# Patient Record
Sex: Female | Born: 1975 | Race: White | Hispanic: No | Marital: Married | Smoking: Never smoker
Health system: Southern US, Community
[De-identification: ages and names within clinical notes are randomized; demographics above are authoritative.]

## PROBLEM LIST (undated history)

## (undated) DIAGNOSIS — Z789 Other specified health status: Secondary | ICD-10-CM

## (undated) DIAGNOSIS — R112 Nausea with vomiting, unspecified: Secondary | ICD-10-CM

## (undated) DIAGNOSIS — K589 Irritable bowel syndrome without diarrhea: Secondary | ICD-10-CM

## (undated) DIAGNOSIS — A64 Unspecified sexually transmitted disease: Secondary | ICD-10-CM

## (undated) DIAGNOSIS — Z803 Family history of malignant neoplasm of breast: Secondary | ICD-10-CM

## (undated) HISTORY — PX: OTHER SURGICAL HISTORY: SHX169

## (undated) HISTORY — DX: Nausea with vomiting, unspecified: R11.2

## (undated) HISTORY — DX: Irritable bowel syndrome, unspecified: K58.9

## (undated) HISTORY — DX: Unspecified sexually transmitted disease: A64

## (undated) HISTORY — DX: Family history of malignant neoplasm of breast: Z80.3

---

## 2004-11-23 ENCOUNTER — Emergency Department (HOSPITAL_COMMUNITY): Admission: EM | Admit: 2004-11-23 | Discharge: 2004-11-23 | Payer: Self-pay | Admitting: Emergency Medicine

## 2005-11-03 ENCOUNTER — Encounter: Admission: RE | Admit: 2005-11-03 | Discharge: 2005-12-05 | Payer: Self-pay | Admitting: Sports Medicine

## 2010-05-20 ENCOUNTER — Other Ambulatory Visit: Payer: Self-pay | Admitting: Family Medicine

## 2010-05-20 ENCOUNTER — Ambulatory Visit
Admission: RE | Admit: 2010-05-20 | Discharge: 2010-05-20 | Disposition: A | Discharge status: Home / Self Care | Payer: PRIVATE HEALTH INSURANCE | Source: Ambulatory Visit | Attending: Family Medicine | Admitting: Family Medicine

## 2010-06-24 LAB — HIV ANTIBODY (ROUTINE TESTING W REFLEX): HIV: NONREACTIVE

## 2010-06-24 LAB — HEPATITIS B SURFACE ANTIGEN: Hepatitis B Surface Ag: NEGATIVE

## 2010-06-24 LAB — GC/CHLAMYDIA PROBE AMP, GENITAL: Gonorrhea: NEGATIVE

## 2010-07-15 DIAGNOSIS — A088 Other specified intestinal infections: Secondary | ICD-10-CM

## 2010-08-26 ENCOUNTER — Inpatient Hospital Stay (HOSPITAL_COMMUNITY)
Admission: AD | Admit: 2010-08-26 | Discharge: 2010-08-26 | Disposition: A | Discharge status: Home / Self Care | Payer: BC Managed Care – PPO | Source: Ambulatory Visit | Attending: Obstetrics and Gynecology | Admitting: Obstetrics and Gynecology

## 2010-08-26 ENCOUNTER — Encounter (HOSPITAL_COMMUNITY): Payer: Self-pay | Admitting: *Deleted

## 2010-08-26 DIAGNOSIS — A084 Viral intestinal infection, unspecified: Secondary | ICD-10-CM

## 2010-08-26 DIAGNOSIS — Z3490 Encounter for supervision of normal pregnancy, unspecified, unspecified trimester: Secondary | ICD-10-CM

## 2010-08-26 DIAGNOSIS — A088 Other specified intestinal infections: Secondary | ICD-10-CM | POA: Insufficient documentation

## 2010-08-26 DIAGNOSIS — O210 Mild hyperemesis gravidarum: Secondary | ICD-10-CM | POA: Insufficient documentation

## 2010-08-26 DIAGNOSIS — R109 Unspecified abdominal pain: Secondary | ICD-10-CM | POA: Insufficient documentation

## 2010-08-26 DIAGNOSIS — O26899 Other specified pregnancy related conditions, unspecified trimester: Secondary | ICD-10-CM | POA: Diagnosis present

## 2010-08-26 DIAGNOSIS — O99891 Other specified diseases and conditions complicating pregnancy: Secondary | ICD-10-CM | POA: Insufficient documentation

## 2010-08-26 LAB — COMPREHENSIVE METABOLIC PANEL
ALT: 41 U/L — ABNORMAL HIGH (ref 0–35)
AST: 27 U/L (ref 0–37)
Albumin: 3 g/dL — ABNORMAL LOW (ref 3.5–5.2)
BUN: 8 mg/dL (ref 6–23)
CO2: 24 mEq/L (ref 19–32)
Calcium: 8.5 mg/dL (ref 8.4–10.5)
Chloride: 100 mEq/L (ref 96–112)
Creatinine, Ser: 0.6 mg/dL (ref 0.50–1.10)
GFR calc Af Amer: >60 mL/min (ref >60)
GFR calc non Af Amer: >60 mL/min (ref >60)
Glucose, Bld: 91 mg/dL (ref 70–99)
Potassium: 3.5 mEq/L (ref 3.5–5.1)
Total Bilirubin: 0.6 mg/dL (ref 0.3–1.2)
Total Protein: 7 g/dL (ref 6.0–8.3)

## 2010-08-26 LAB — URINE MICROSCOPIC-ADD ON

## 2010-08-26 LAB — CBC
HCT: 35.4 % — ABNORMAL LOW (ref 36.0–46.0)
MCH: 31.3 pg (ref 26.0–34.0)
MCHC: 34.2 g/dL (ref 30.0–36.0)
MCV: 91.7 fL (ref 78.0–100.0)
Platelets: 186 10*3/uL (ref 150–400)
RBC: 3.86 MIL/uL — ABNORMAL LOW (ref 3.87–5.11)
RDW: 13.3 % (ref 11.5–15.5)
WBC: 11.3 10*3/uL — ABNORMAL HIGH (ref 4.0–10.5)

## 2010-08-26 LAB — URINALYSIS, ROUTINE W REFLEX MICROSCOPIC
Glucose, UA: NEGATIVE mg/dL
Ketones, ur: NEGATIVE mg/dL
Leukocytes, UA: NEGATIVE
Nitrite: NEGATIVE
Protein, ur: NEGATIVE mg/dL
Specific Gravity, Urine: 1.01 (ref 1.005–1.030)
Urobilinogen, UA: 0.2 mg/dL (ref 0.0–1.0)

## 2010-08-26 MED ORDER — OXYCODONE-ACETAMINOPHEN 5-325 MG PO TABS
1.0000 | ORAL_TABLET | Freq: Once | ORAL | Status: AC
  Administered 2010-08-26: 1 via ORAL

## 2010-08-26 MED ORDER — CYCLOBENZAPRINE HCL 10 MG PO TABS: 10.0000 mg | ORAL_TABLET | Freq: Three times a day (TID) | ORAL | Status: AC | PRN

## 2010-08-26 MED ORDER — OXYCODONE-ACETAMINOPHEN 5-325 MG PO TABS: 1.0000 | ORAL_TABLET | Freq: Four times a day (QID) | ORAL | Status: DC | PRN

## 2010-08-26 MED ORDER — GI COCKTAIL ~~LOC~~
30.0000 mL | Freq: Once | ORAL | Status: AC
  Administered 2010-08-26: 30 mL via ORAL

## 2010-08-26 MED ORDER — RANITIDINE HCL 150 MG PO TABS: 150.0000 mg | ORAL_TABLET | Freq: Two times a day (BID) | ORAL | Status: DC

## 2010-08-26 NOTE — ED Provider Notes (Signed)
History   G2P1001 @ 18.2 wks  About 9:30 last night started feeling sick, vomiting started around midnight, frequent until about 5 AM. No vomiting since. Diarrhea x 1 today. Temp of 100.7 earlier today. Ate just a few crackers today, able to drink water. 5:30 PM, right sided chest pain started, moved to RUQ. Occasional nausea now. H/O IBS. No dysuria. Took Zofran about 9 AM, Tylenol 1000 mg three times today, last at 5 PM.  Chief Complaint  Patient presents with  . Fever  . Chest Pain   HPI  OB History    Grav Para Term Preterm Abortions TAB SAB Ect Mult Living   2 1 1       1       History reviewed. No pertinent past medical history.  Past Surgical History  Procedure Date  . Cesarean section   . Left knee surgery     No family history on file.  History  Substance Use Topics  . Smoking status: Never Smoker   . Smokeless tobacco: Not on file  . Alcohol Use: No    Allergies:  Allergies  Allergen Reactions  . Iodine Rash  . Zithromax (Azithromycin Dihydrate) Rash    No prescriptions prior to admission    Review of Systems  Constitutional: Positive for fever and chills.  HENT: Negative for congestion and sore throat.   Eyes: Negative.   Respiratory: Negative.   Cardiovascular: Negative.   Gastrointestinal: Positive for nausea, vomiting, abdominal pain and diarrhea. Negative for constipation.  Neurological: Positive for headaches.   Physical Exam   Blood pressure 110/68, pulse 91, temperature 98.9 F (37.2 C), temperature source Oral, resp. rate 18, height 5' 2.75" (1.594 m), weight 75.978 kg (167 lb 8 oz).  Physical Exam  Nursing note and vitals reviewed. Constitutional: She is oriented to person, place, and time. She appears well-developed and well-nourished. No distress.  HENT:  Head: Normocephalic and atraumatic.  Cardiovascular: Normal rate.   Respiratory: Effort normal. She exhibits tenderness (left rib cage tender).  GI: Soft. Bowel sounds are normal.  She exhibits no mass. There is tenderness (epigastric). There is no rebound, no guarding, no CVA tenderness, no tenderness at McBurney's point and negative Murphy's sign.  Genitourinary: Enlarged: Size c/w dates.  Musculoskeletal: Normal range of motion.  Neurological: She is alert and oriented to person, place, and time.  Skin: Skin is warm and dry.  Psychiatric: She has a normal mood and affect.    MAU Course  Procedures Pain improved following GI cocktail and Percocet, ok to d/c home per Dr. Arelia Sneddon - f/u in office on Monday   Assessment and Plan  34 y.o. G2P1001 at [redacted]w[redacted]d F/U in office on Monday for repeat LFTs  Robin Black, Robin Black  Home Medication Instructions [id number]   Printed on:08/27/10 0258  Medication Information                    acetaminophen (TYLENOL) 500 MG tablet Take 1,000 mg by mouth every 6 (six) hours as needed. As needed for pain            Prenatal Multivit-Min-Fe-FA (PRENATAL #2 PO) Take 1 tablet by mouth daily.             DULoxetine (CYMBALTA) 30 MG capsule Take 30 mg by mouth daily. Weaning off, currently taking 30mg  every other day            ondansetron (ZOFRAN-ODT) 8 MG disintegrating tablet Take 8 mg by mouth every 8 (  eight) hours as needed. As needed for nausea and vomiting            oxyCODONE-acetaminophen (ROXICET) 5-325 MG per tablet Take 1 tablet by mouth every 6 (six) hours as needed for pain.           ranitidine (ZANTAC) 150 MG tablet Take 1 tablet (150 mg total) by mouth 2 (two) times daily.           cyclobenzaprine (FLEXERIL) 10 MG tablet Take 1 tablet (10 mg total) by mouth 3 (three) times daily as needed for muscle spasms.              Deacon Gadbois 08/26/2010, 9:15 PM

## 2010-08-26 NOTE — Progress Notes (Signed)
Pt states, " I started vomiting at midnight and it lasted until 5 am. I'm mostly been in the bed and sleep. Im keeping fluids down now. I have pain in the right side of my chest all the way to my ribs and it hurts when I take a deep breath. It started out like a sharp pain, and then it felt like a bubble moving and it has settled under my ribs. I feel achy and can't get comfortable. My temp was 100.7. I took tylenol at 4:4." 5 pm

## 2010-08-26 NOTE — Progress Notes (Signed)
Patient is here with c/o n/v last night. She states that had 100.51f fever and took 1000mg  tylenol at 1640pm and zofran 8mg  this morning. She is still c/o nausea, ruq radiates to the back. She denies any vaginal bleeding or discharge.

## 2010-08-31 ENCOUNTER — Other Ambulatory Visit (HOSPITAL_COMMUNITY): Payer: Self-pay | Admitting: Obstetrics and Gynecology

## 2010-08-31 DIAGNOSIS — R1011 Right upper quadrant pain: Secondary | ICD-10-CM

## 2010-09-01 ENCOUNTER — Ambulatory Visit (HOSPITAL_COMMUNITY)
Admission: RE | Admit: 2010-09-01 | Discharge: 2010-09-01 | Disposition: A | Discharge status: Home / Self Care | Payer: BC Managed Care – PPO | Source: Ambulatory Visit | Attending: Obstetrics and Gynecology | Admitting: Obstetrics and Gynecology

## 2010-09-01 DIAGNOSIS — K824 Cholesterolosis of gallbladder: Secondary | ICD-10-CM | POA: Insufficient documentation

## 2010-09-01 DIAGNOSIS — O99891 Other specified diseases and conditions complicating pregnancy: Secondary | ICD-10-CM | POA: Insufficient documentation

## 2010-09-01 DIAGNOSIS — R1011 Right upper quadrant pain: Secondary | ICD-10-CM | POA: Insufficient documentation

## 2010-09-02 ENCOUNTER — Ambulatory Visit (HOSPITAL_COMMUNITY): Payer: BC Managed Care – PPO

## 2010-09-05 ENCOUNTER — Ambulatory Visit (INDEPENDENT_AMBULATORY_CARE_PROVIDER_SITE_OTHER): Payer: Self-pay | Admitting: Surgery

## 2010-09-05 LAB — US OB COMP ADDL GEST + 14 WK

## 2010-09-14 ENCOUNTER — Ambulatory Visit (INDEPENDENT_AMBULATORY_CARE_PROVIDER_SITE_OTHER): Payer: BC Managed Care – PPO | Admitting: General Surgery

## 2010-09-14 ENCOUNTER — Encounter (INDEPENDENT_AMBULATORY_CARE_PROVIDER_SITE_OTHER): Payer: Self-pay | Admitting: General Surgery

## 2010-09-14 VITALS — BP 122/78 | HR 86 | Temp 96.8°F | Wt 168.8 lb

## 2010-09-14 DIAGNOSIS — K8020 Calculus of gallbladder without cholecystitis without obstruction: Secondary | ICD-10-CM

## 2010-09-14 NOTE — Patient Instructions (Signed)
Continue with low fat diet small servings, and not eating and going to the bed. If you have a episode of pain you may take Vicodin or Percocet if the pain does not resolve and you have to take a second pain tablet leaves let us know you have fever or think you are turning yellow let us know the promply.

## 2010-09-14 NOTE — Progress Notes (Signed)
Subjective:     Patient ID: Robin Black, female   DOB: 1975/06/02, 35 y.o.   MRN: [id number]  HPIPatient's a 35 year old Caucasian female mother one with her second pregnancy presently approximately 18 weeks area the patient has had a lot of right upper quadrant abdominal pain that started basically about the time of her pregnancy and originally was thought to be nonspecific she's had more recently a severe episode of painless motion of a lot of nausea and vomiting this occurred after eating steak and accessory is approximately 10 days ago. The pain lasted approximately 24 hours and then the pain has since decreased. She was seen and [facility] and Dr. Carloyn Jaeger and they did laboratory studies white count was 11,500 hematocrit 33 normal electrolytes glucose 129, normal bilirubin, slightly elevated SGOT SGPT. The patient was then sent for an ultrasound that showed a mildly dilated gallbladder with what appears to be a large single stone that didn't i and that they're sludge in the more dependent portion of the gallbladder.. The patient's pain has  decreased essentially asymptomatic at this .   The patient's husband is present for this examination. I reviewed the little booklet on gallbladder function were and she has a copy of that book.  Review of Systems One previous pregnancy  girl 10. Sh that plan to induce her and probably a C-section in the last week in December . Current Outpatient Prescriptions  Medication Sig Dispense Refill  . acetaminophen (TYLENOL) 500 MG tablet Take 1,000 mg by mouth every 6 (six) hours as needed. As needed for pain       . DULoxetine (CYMBALTA) 30 MG capsule Take 30 mg by mouth daily. Weaning off, currently taking 30mg  every other day       . ondansetron (ZOFRAN-ODT) 8 MG disintegrating tablet Take 8 mg by mouth every 8 (eight) hours as needed. As needed for nausea and vomiting       . oxyCODONE-acetaminophen (ROXICET) 5-325 MG per tablet Take 1 tablet by  mouth every 6 (six) hours as needed for pain.  20 tablet  0  . Prenatal Multivit-Min-Fe-FA (PRENATAL #2 PO) Take 1 tablet by mouth daily.        . ranitidine (ZANTAC) 150 MG tablet Take 1 tablet (150 mg total) by mouth 2 (two) times daily.  60 tablet  1   Past Surgical History  Procedure Date  . Cesarean section   . Left knee surgery    Allergies  Allergen Reactions  . Iodine Rash  . Zithromax (Azithromycin Dihydrate) Rash  Family history is negative for gallstones according to the patient       Objective:   Physical ExamBP 122/78  Pulse 86  Temp(Src) 96.8 F (36 C) (Temporal)  Wt 168 lb 12.8 oz (76.567 kg) Pleasant female no acute distress his office to pregnant. Her uterus is now proximal right-hand size above her umbilicus and she's not tender in the right upper quadrant she has not had nausea and vomited in the last view days.    Assessment:    Patient's presently 18 and 19 weeks turn A. intrauterine pregnancy and presently essentially asymptomatic from the gallbladder. I would recommend that we continue with her all her low-fat diet not a large meal not going to bed within 3-4 hours after eating. She understands that she has an episode of pain she can take Percocet which she has she has to take a second pain tablet with back the pain resolved and or develops  fever or generalized symptoms that we be notified promptly. She would need to be in [facility] in both her gynecologist and general surgeon would work together if she is having meniscus symptoms closer to her term we discussed the options of possibly percutaneous drainage of the gallbladder but hopefully that will not be need area hopefully if she's had a schedule C-section and we could plan on doing an elective cholecystectomy sometime in January if no acute issues occur she is aware that I am retiring at the end of the year and one of my partners will be performing her surgery hopefully at [facility].     Plan:       See above note

## 2010-11-09 ENCOUNTER — Emergency Department (HOSPITAL_COMMUNITY): Payer: BC Managed Care – PPO

## 2010-11-09 ENCOUNTER — Inpatient Hospital Stay (HOSPITAL_COMMUNITY)
Admission: AD | Admit: 2010-11-09 | Discharge: 2010-11-09 | Disposition: A | Discharge status: Home / Self Care | Payer: BC Managed Care – PPO | Source: Ambulatory Visit | Attending: Obstetrics and Gynecology | Admitting: Obstetrics and Gynecology

## 2010-11-09 ENCOUNTER — Inpatient Hospital Stay (HOSPITAL_COMMUNITY)
Admission: AD | Admit: 2010-11-09 | Payer: BC Managed Care – PPO | Source: Ambulatory Visit | Admitting: Obstetrics and Gynecology

## 2010-11-09 ENCOUNTER — Encounter (HOSPITAL_COMMUNITY): Payer: Self-pay | Admitting: *Deleted

## 2010-11-09 ENCOUNTER — Inpatient Hospital Stay (HOSPITAL_COMMUNITY): Payer: BC Managed Care – PPO

## 2010-11-09 ENCOUNTER — Encounter (HOSPITAL_COMMUNITY): Payer: Self-pay

## 2010-11-09 ENCOUNTER — Emergency Department (HOSPITAL_COMMUNITY)
Admission: EM | Admit: 2010-11-09 | Discharge: 2010-11-09 | Disposition: A | Discharge status: Other Acute Inpatient Hospital | Payer: BC Managed Care – PPO | Source: Home / Self Care | Attending: Emergency Medicine | Admitting: Emergency Medicine

## 2010-11-09 DIAGNOSIS — Z3490 Encounter for supervision of normal pregnancy, unspecified, unspecified trimester: Secondary | ICD-10-CM

## 2010-11-09 DIAGNOSIS — M545 Low back pain, unspecified: Secondary | ICD-10-CM | POA: Insufficient documentation

## 2010-11-09 DIAGNOSIS — M549 Dorsalgia, unspecified: Secondary | ICD-10-CM | POA: Insufficient documentation

## 2010-11-09 DIAGNOSIS — M542 Cervicalgia: Secondary | ICD-10-CM

## 2010-11-09 DIAGNOSIS — Y92410 Unspecified street and highway as the place of occurrence of the external cause: Secondary | ICD-10-CM | POA: Insufficient documentation

## 2010-11-09 DIAGNOSIS — R51 Headache: Secondary | ICD-10-CM | POA: Insufficient documentation

## 2010-11-09 DIAGNOSIS — O99891 Other specified diseases and conditions complicating pregnancy: Secondary | ICD-10-CM | POA: Insufficient documentation

## 2010-11-09 DIAGNOSIS — Z3480 Encounter for supervision of other normal pregnancy, unspecified trimester: Secondary | ICD-10-CM

## 2010-11-09 LAB — DIFFERENTIAL
Basophils Absolute: 0 10*3/uL (ref 0.0–0.1)
Basophils Relative: 0 % (ref 0–1)
Eosinophils Absolute: 0.1 10*3/uL (ref 0.0–0.7)
Eosinophils Relative: 1 % (ref 0–5)
Lymphocytes Relative: 14 % (ref 12–46)
Lymphs Abs: 1.9 10*3/uL (ref 0.7–4.0)
Monocytes Absolute: 1.2 10*3/uL — ABNORMAL HIGH (ref 0.1–1.0)
Monocytes Relative: 9 % (ref 3–12)
Neutro Abs: 10.2 10*3/uL — ABNORMAL HIGH (ref 1.7–7.7)
Neutrophils Relative %: 76 % (ref 43–77)

## 2010-11-09 LAB — CBC
HCT: 33.4 % — ABNORMAL LOW (ref 36.0–46.0)
Hemoglobin: 11.4 g/dL — ABNORMAL LOW (ref 12.0–15.0)
MCV: 90.8 fL (ref 78.0–100.0)
RBC: 3.68 MIL/uL — ABNORMAL LOW (ref 3.87–5.11)
RDW: 13.1 % (ref 11.5–15.5)
WBC: 13.5 10*3/uL — ABNORMAL HIGH (ref 4.0–10.5)

## 2010-11-09 LAB — BASIC METABOLIC PANEL
BUN: 8 mg/dL (ref 6–23)
CO2: 22 mEq/L (ref 19–32)
Calcium: 9 mg/dL (ref 8.4–10.5)
Chloride: 105 mEq/L (ref 96–112)
Creatinine, Ser: 0.63 mg/dL (ref 0.50–1.10)
GFR calc Af Amer: >90 mL/min (ref >90)
Glucose, Bld: 96 mg/dL (ref 70–99)
Potassium: 3.9 mEq/L (ref 3.5–5.1)
Sodium: 137 mEq/L (ref 135–145)

## 2010-11-09 LAB — PROTIME-INR: INR: 0.96 (ref 0.00–1.49)

## 2010-11-09 LAB — KLEIHAUER-BETKE STAIN
Fetal Cells %: 0 %
Quantitation Fetal Hemoglobin: 0 mL
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
## 2010-12-08 ENCOUNTER — Inpatient Hospital Stay (HOSPITAL_COMMUNITY)
Admission: AD | Admit: 2010-12-08 | Payer: BC Managed Care – PPO | Source: Ambulatory Visit | Admitting: Obstetrics and Gynecology

## 2011-01-10 ENCOUNTER — Encounter (HOSPITAL_COMMUNITY): Payer: Self-pay | Admitting: Pharmacist

## 2011-01-19 ENCOUNTER — Encounter (HOSPITAL_COMMUNITY)
Admission: RE | Admit: 2011-01-19 | Discharge: 2011-01-19 | Disposition: A | Discharge status: Home / Self Care | Payer: BC Managed Care – PPO | Source: Ambulatory Visit | Attending: Obstetrics and Gynecology | Admitting: Obstetrics and Gynecology

## 2011-01-19 ENCOUNTER — Encounter (HOSPITAL_COMMUNITY): Payer: Self-pay

## 2011-01-19 HISTORY — DX: Other specified health status: Z78.9

## 2011-01-19 LAB — CBC
HCT: 34.8 % — ABNORMAL LOW (ref 36.0–46.0)
Hemoglobin: 11.7 g/dL — ABNORMAL LOW (ref 12.0–15.0)
RBC: 3.92 MIL/uL (ref 3.87–5.11)
WBC: 11.9 10*3/uL — ABNORMAL HIGH (ref 4.0–10.5)

## 2011-01-19 LAB — RPR: RPR Ser Ql: NONREACTIVE

## 2011-01-19 LAB — SURGICAL PCR SCREEN: Staphylococcus aureus: NEGATIVE

## 2011-01-19 NOTE — Pre-Procedure Instructions (Signed)
Pt will breastfeed Pediatrician is B. O'Kelley

## 2011-01-19 NOTE — Patient Instructions (Signed)
YOUR PROCEDURE IS SCHEDULED ON:01/23/11  ENTER THROUGH THE MAIN ENTRANCE OF [facility] AT:1130 am  USE DESK PHONE AND DIAL [id number] TO INFORM us OF YOUR ARRIVAL  CALL [phone] IF YOU HAVE ANY QUESTIONS OR PROBLEMS PRIOR TO YOUR ARRIVAL.  REMEMBER: DO NOT EAT AFTER MIDNIGHT :Sunday  SPECIAL INSTRUCTIONS:clear liquids ok until 9am on Monday   YOU MAY BRUSH YOUR TEETH THE MORNING OF SURGERY   TAKE THESE MEDICINES THE DAY OF SURGERY WITH SIP OF WATER:none   DO NOT WEAR JEWELRY, EYE MAKEUP, LIPSTICK OR DARK FINGERNAIL POLISH DO NOT WEAR LOTIONS  DO NOT SHAVE FOR 48 HOURS PRIOR TO SURGERY  YOU WILL NOT BE ALLOWED TO DRIVE YOURSELF HOME.  NAME OF DRIVERClifton Custard- [phone]

## 2011-01-23 ENCOUNTER — Encounter (HOSPITAL_COMMUNITY)
Admission: RE | Disposition: A | Discharge status: Home / Self Care | Payer: Self-pay | Source: Ambulatory Visit | Attending: Obstetrics and Gynecology

## 2011-01-23 ENCOUNTER — Inpatient Hospital Stay (HOSPITAL_COMMUNITY)
Admission: RE | Admit: 2011-01-23 | Discharge: 2011-01-25 | DRG: 371 | Disposition: A | Discharge status: Home / Self Care | Payer: BC Managed Care – PPO | Source: Ambulatory Visit | Attending: Obstetrics and Gynecology | Admitting: Obstetrics and Gynecology

## 2011-01-23 ENCOUNTER — Encounter (HOSPITAL_COMMUNITY): Payer: Self-pay | Admitting: Anesthesiology

## 2011-01-23 ENCOUNTER — Inpatient Hospital Stay (HOSPITAL_COMMUNITY): Payer: BC Managed Care – PPO | Admitting: Anesthesiology

## 2011-01-23 ENCOUNTER — Encounter (HOSPITAL_COMMUNITY): Payer: Self-pay | Admitting: Surgery

## 2011-01-23 DIAGNOSIS — O34219 Maternal care for unspecified type scar from previous cesarean delivery: Principal | ICD-10-CM | POA: Diagnosis present

## 2011-01-23 DIAGNOSIS — K8020 Calculus of gallbladder without cholecystitis without obstruction: Secondary | ICD-10-CM | POA: Diagnosis present

## 2011-01-23 DIAGNOSIS — A084 Viral intestinal infection, unspecified: Secondary | ICD-10-CM

## 2011-01-23 DIAGNOSIS — O26899 Other specified pregnancy related conditions, unspecified trimester: Secondary | ICD-10-CM | POA: Diagnosis present

## 2011-01-23 DIAGNOSIS — Z3490 Encounter for supervision of normal pregnancy, unspecified, unspecified trimester: Secondary | ICD-10-CM

## 2011-01-23 SURGERY — Surgical Case
Anesthesia: Spinal | Site: Abdomen | Wound class: Clean Contaminated

## 2011-01-23 MED ORDER — MEPERIDINE HCL 25 MG/ML IJ SOLN: 6.2500 mg | INTRAMUSCULAR | Status: DC | PRN

## 2011-01-23 MED ORDER — FLEET ENEMA 7-19 GM/118ML RE ENEM: 1.0000 | ENEMA | Freq: Every day | RECTAL | Status: DC | PRN

## 2011-01-23 MED ORDER — DIPHENHYDRAMINE HCL 25 MG PO CAPS: 25.0000 mg | ORAL_CAPSULE | Freq: Four times a day (QID) | ORAL | Status: DC | PRN

## 2011-01-23 MED ORDER — MORPHINE SULFATE 0.5 MG/ML IJ SOLN
INTRAMUSCULAR | Status: AC
  Filled 2011-01-23: qty 10

## 2011-01-23 MED ORDER — EPHEDRINE SULFATE 50 MG/ML IJ SOLN
INTRAMUSCULAR | Status: DC | PRN
  Administered 2011-01-23 (×7): 10 mg via INTRAVENOUS

## 2011-01-23 MED ORDER — SODIUM CHLORIDE 0.9 % IV SOLN: 1.0000 ug/kg/h | INTRAVENOUS | Status: DC | PRN

## 2011-01-23 MED ORDER — TETANUS-DIPHTH-ACELL PERTUSSIS 5-2.5-18.5 LF-MCG/0.5 IM SUSP: 0.5000 mL | Freq: Once | INTRAMUSCULAR | Status: DC

## 2011-01-23 MED ORDER — PHENYLEPHRINE 40 MCG/ML (10ML) SYRINGE FOR IV PUSH (FOR BLOOD PRESSURE SUPPORT)
PREFILLED_SYRINGE | INTRAVENOUS | Status: AC
  Filled 2011-01-23: qty 25

## 2011-01-23 MED ORDER — DIPHENHYDRAMINE HCL 50 MG/ML IJ SOLN: 25.0000 mg | INTRAMUSCULAR | Status: DC | PRN

## 2011-01-23 MED ORDER — ONDANSETRON HCL 4 MG/2ML IJ SOLN
4.0000 mg | INTRAMUSCULAR | Status: DC | PRN
Start: 2011-01-23 — End: 2011-01-25

## 2011-01-23 MED ORDER — FENTANYL CITRATE 0.05 MG/ML IJ SOLN
INTRAMUSCULAR | Status: AC
  Filled 2011-01-23: qty 2

## 2011-01-23 MED ORDER — LACTATED RINGERS IV SOLN
INTRAVENOUS | Status: DC
  Administered 2011-01-23 (×3): via INTRAVENOUS

## 2011-01-23 MED ORDER — SODIUM CHLORIDE 0.9 % IJ SOLN: 3.0000 mL | INTRAMUSCULAR | Status: DC | PRN

## 2011-01-23 MED ORDER — KETOROLAC TROMETHAMINE 30 MG/ML IJ SOLN: 30.0000 mg | Freq: Four times a day (QID) | INTRAMUSCULAR | Status: AC | PRN

## 2011-01-23 MED ORDER — DIBUCAINE 1 % RE OINT: 1.0000 "application " | TOPICAL_OINTMENT | RECTAL | Status: DC | PRN

## 2011-01-23 MED ORDER — KETOROLAC TROMETHAMINE 30 MG/ML IJ SOLN
INTRAMUSCULAR | Status: AC
  Administered 2011-01-23: 30 mg via INTRAVENOUS
  Filled 2011-01-23: qty 1

## 2011-01-23 MED ORDER — ZOLPIDEM TARTRATE 5 MG PO TABS: 5.0000 mg | ORAL_TABLET | Freq: Every evening | ORAL | Status: DC | PRN

## 2011-01-23 MED ORDER — ONDANSETRON HCL 4 MG/2ML IJ SOLN
INTRAMUSCULAR | Status: AC
  Filled 2011-01-23: qty 2

## 2011-01-23 MED ORDER — NALBUPHINE SYRINGE 5 MG/0.5 ML
5.0000 mg | INJECTION | INTRAMUSCULAR | Status: DC | PRN
Start: 2011-01-23 — End: 2011-01-25
  Administered 2011-01-23: 10 mg via SUBCUTANEOUS
  Filled 2011-01-23 (×2): qty 1

## 2011-01-23 MED ORDER — NALBUPHINE SYRINGE 5 MG/0.5 ML
5.0000 mg | INJECTION | INTRAMUSCULAR | Status: DC | PRN
  Administered 2011-01-23 – 2011-01-24 (×2): 5 mg via INTRAVENOUS
  Filled 2011-01-23 (×2): qty 1

## 2011-01-23 MED ORDER — LACTATED RINGERS IV SOLN
INTRAVENOUS | Status: DC
  Administered 2011-01-23: 12:00:00 via INTRAVENOUS

## 2011-01-23 MED ORDER — BISACODYL 10 MG RE SUPP
10.0000 mg | Freq: Every day | RECTAL | Status: DC | PRN
  Administered 2011-01-25: 10 mg via RECTAL
  Filled 2011-01-23: qty 1

## 2011-01-23 MED ORDER — SODIUM CHLORIDE 0.9 % IV SOLN: 250.0000 mL | INTRAVENOUS | Status: DC | PRN

## 2011-01-23 MED ORDER — EPHEDRINE 5 MG/ML INJ
INTRAVENOUS | Status: AC
  Filled 2011-01-23: qty 20

## 2011-01-23 MED ORDER — ONDANSETRON HCL 4 MG/2ML IJ SOLN
INTRAMUSCULAR | Status: DC | PRN
  Administered 2011-01-23: 4 mg via INTRAVENOUS

## 2011-01-23 MED ORDER — BUPIVACAINE IN DEXTROSE 0.75-8.25 % IT SOLN
INTRATHECAL | Status: DC | PRN
  Administered 2011-01-23: 11 mg via INTRATHECAL

## 2011-01-23 MED ORDER — DIPHENHYDRAMINE HCL 25 MG PO CAPS
25.0000 mg | ORAL_CAPSULE | ORAL | Status: DC | PRN
  Administered 2011-01-24: 25 mg via ORAL
  Filled 2011-01-23: qty 1

## 2011-01-23 MED ORDER — WITCH HAZEL-GLYCERIN EX PADS: 1.0000 "application " | MEDICATED_PAD | CUTANEOUS | Status: DC | PRN

## 2011-01-23 MED ORDER — FENTANYL CITRATE 0.05 MG/ML IJ SOLN: 25.0000 ug | INTRAMUSCULAR | Status: DC | PRN

## 2011-01-23 MED ORDER — SCOPOLAMINE 1 MG/3DAYS TD PT72
1.0000 | MEDICATED_PATCH | Freq: Once | TRANSDERMAL | Status: DC
  Administered 2011-01-23: 1.5 mg via TRANSDERMAL

## 2011-01-23 MED ORDER — OXYTOCIN 10 UNIT/ML IJ SOLN
INTRAMUSCULAR | Status: AC
Start: 2011-01-23 — End: 2011-01-23
  Filled 2011-01-23: qty 4

## 2011-01-23 MED ORDER — OXYTOCIN 20 UNITS IN LACTATED RINGERS INFUSION - SIMPLE
INTRAVENOUS | Status: DC | PRN
  Administered 2011-01-23 (×2): 20 [IU] via INTRAVENOUS

## 2011-01-23 MED ORDER — PHENYLEPHRINE HCL 10 MG/ML IJ SOLN
INTRAMUSCULAR | Status: DC | PRN
  Administered 2011-01-23: 40 ug via INTRAVENOUS
  Administered 2011-01-23: 80 ug via INTRAVENOUS
  Administered 2011-01-23 (×4): 40 ug via INTRAVENOUS
  Administered 2011-01-23 (×2): 80 ug via INTRAVENOUS
  Administered 2011-01-23 (×5): 40 ug via INTRAVENOUS
  Administered 2011-01-23 (×3): 80 ug via INTRAVENOUS
  Administered 2011-01-23: 40 ug via INTRAVENOUS
  Administered 2011-01-23: 80 ug via INTRAVENOUS

## 2011-01-23 MED ORDER — NALOXONE HCL 0.4 MG/ML IJ SOLN: 0.4000 mg | INTRAMUSCULAR | Status: DC | PRN

## 2011-01-23 MED ORDER — CEFAZOLIN SODIUM 1-5 GM-% IV SOLN
1.0000 g | INTRAVENOUS | Status: AC
  Administered 2011-01-23: 1 g via INTRAVENOUS

## 2011-01-23 MED ORDER — SIMETHICONE 80 MG PO CHEW
80.0000 mg | CHEWABLE_TABLET | Freq: Three times a day (TID) | ORAL | Status: DC
  Administered 2011-01-23 – 2011-01-25 (×6): 80 mg via ORAL

## 2011-01-23 MED ORDER — OXYCODONE-ACETAMINOPHEN 5-325 MG PO TABS
1.0000 | ORAL_TABLET | Freq: Four times a day (QID) | ORAL | Status: DC | PRN
  Administered 2011-01-24: 1 via ORAL
  Filled 2011-01-23: qty 1

## 2011-01-23 MED ORDER — SODIUM CHLORIDE 0.9 % IJ SOLN
3.0000 mL | Freq: Two times a day (BID) | INTRAMUSCULAR | Status: DC
  Administered 2011-01-24: 3 mL via INTRAVENOUS

## 2011-01-23 MED ORDER — MENTHOL 3 MG MT LOZG
1.0000 | LOZENGE | OROMUCOSAL | Status: DC | PRN
  Administered 2011-01-23: 3 mg via ORAL
  Filled 2011-01-23: qty 9

## 2011-01-23 MED ORDER — FENTANYL CITRATE 0.05 MG/ML IJ SOLN
INTRAMUSCULAR | Status: DC | PRN
  Administered 2011-01-23: 25 ug via INTRATHECAL

## 2011-01-23 MED ORDER — PRENATAL MULTIVITAMIN CH
1.0000 | ORAL_TABLET | Freq: Every day | ORAL | Status: DC
  Administered 2011-01-24 – 2011-01-25 (×2): 1 via ORAL
  Filled 2011-01-23 (×2): qty 1

## 2011-01-23 MED ORDER — IBUPROFEN 800 MG PO TABS
800.0000 mg | ORAL_TABLET | Freq: Three times a day (TID) | ORAL | Status: DC | PRN
  Administered 2011-01-24 – 2011-01-25 (×6): 800 mg via ORAL
  Filled 2011-01-23 (×6): qty 1

## 2011-01-23 MED ORDER — SERTRALINE HCL 50 MG PO TABS
50.0000 mg | ORAL_TABLET | Freq: Every day | ORAL | Status: DC
  Administered 2011-01-24 – 2011-01-25 (×2): 50 mg via ORAL
  Filled 2011-01-23 (×3): qty 1

## 2011-01-23 MED ORDER — IBUPROFEN 600 MG PO TABS: 600.0000 mg | ORAL_TABLET | Freq: Four times a day (QID) | ORAL | Status: DC | PRN

## 2011-01-23 MED ORDER — SODIUM CHLORIDE 0.9 % IR SOLN
Status: DC | PRN
  Administered 2011-01-23: 1000 mL

## 2011-01-23 MED ORDER — SCOPOLAMINE 1 MG/3DAYS TD PT72: 1.0000 | MEDICATED_PATCH | Freq: Once | TRANSDERMAL | Status: DC

## 2011-01-23 MED ORDER — KETOROLAC TROMETHAMINE 30 MG/ML IJ SOLN
30.0000 mg | Freq: Four times a day (QID) | INTRAMUSCULAR | Status: AC | PRN
  Administered 2011-01-23 (×2): 30 mg via INTRAVENOUS
  Filled 2011-01-23: qty 1

## 2011-01-23 MED ORDER — MORPHINE SULFATE (PF) 0.5 MG/ML IJ SOLN
INTRAMUSCULAR | Status: DC | PRN
  Administered 2011-01-23: .1 mg via INTRATHECAL

## 2011-01-23 MED ORDER — SENNOSIDES-DOCUSATE SODIUM 8.6-50 MG PO TABS
2.0000 | ORAL_TABLET | Freq: Every day | ORAL | Status: DC
  Administered 2011-01-23 – 2011-01-24 (×2): 2 via ORAL

## 2011-01-23 MED ORDER — PROMETHAZINE HCL 25 MG/ML IJ SOLN: 6.2500 mg | INTRAMUSCULAR | Status: DC | PRN

## 2011-01-23 MED ORDER — ACETAMINOPHEN 325 MG PO TABS: 325.0000 mg | ORAL_TABLET | ORAL | Status: DC | PRN

## 2011-01-23 MED ORDER — METOCLOPRAMIDE HCL 5 MG/ML IJ SOLN: 10.0000 mg | Freq: Three times a day (TID) | INTRAMUSCULAR | Status: DC | PRN

## 2011-01-23 MED ORDER — LANOLIN HYDROUS EX OINT: 1.0000 "application " | TOPICAL_OINTMENT | CUTANEOUS | Status: DC | PRN

## 2011-01-23 MED ORDER — MEASLES, MUMPS & RUBELLA VAC ~~LOC~~ INJ: 0.5000 mL | INJECTION | Freq: Once | SUBCUTANEOUS | Status: DC

## 2011-01-23 MED ORDER — ACETAMINOPHEN 10 MG/ML IV SOLN: 1000.0000 mg | Freq: Four times a day (QID) | INTRAVENOUS | Status: AC | PRN

## 2011-01-23 MED ORDER — ONDANSETRON HCL 4 MG PO TABS: 4.0000 mg | ORAL_TABLET | ORAL | Status: DC | PRN

## 2011-01-23 MED ORDER — DIPHENHYDRAMINE HCL 50 MG/ML IJ SOLN: 12.5000 mg | INTRAMUSCULAR | Status: DC | PRN

## 2011-01-23 MED ORDER — SIMETHICONE 80 MG PO CHEW
80.0000 mg | CHEWABLE_TABLET | ORAL | Status: DC | PRN
  Administered 2011-01-25: 80 mg via ORAL

## 2011-01-23 MED ORDER — ONDANSETRON HCL 4 MG/2ML IJ SOLN: 4.0000 mg | Freq: Three times a day (TID) | INTRAMUSCULAR | Status: DC | PRN

## 2011-01-23 MED ORDER — OXYTOCIN 20 UNITS IN LACTATED RINGERS INFUSION - SIMPLE: 125.0000 mL/h | INTRAVENOUS | Status: AC

## 2011-01-23 SURGICAL SUPPLY — 24 items
CLOTH BEACON ORANGE TIMEOUT ST (SAFETY) ×2 IMPLANT
DRESSING TELFA 8X3 (GAUZE/BANDAGES/DRESSINGS) IMPLANT
DRSG COVADERM 4X8 (GAUZE/BANDAGES/DRESSINGS) ×1 IMPLANT
ELECT REM PT RETURN 9FT ADLT (ELECTROSURGICAL) ×2
ELECTRODE REM PT RTRN 9FT ADLT (ELECTROSURGICAL) ×1 IMPLANT
EXTRACTOR VACUUM M CUP 4 TUBE (SUCTIONS) IMPLANT
GAUZE SPONGE 4X4 12PLY STRL LF (GAUZE/BANDAGES/DRESSINGS) ×4 IMPLANT
GLOVE BIO SURGEON STRL SZ7 (GLOVE) ×4 IMPLANT
GOWN PREVENTION PLUS LG XLONG (DISPOSABLE) ×6 IMPLANT
KIT ABG SYR 3ML LUER SLIP (SYRINGE) IMPLANT
NDL HYPO 25X5/8 SAFETYGLIDE (NEEDLE) ×1 IMPLANT
NEEDLE HYPO 25X5/8 SAFETYGLIDE (NEEDLE) ×2 IMPLANT
NS IRRIG 1000ML POUR BTL (IV SOLUTION) ×2 IMPLANT
PACK C SECTION WH (CUSTOM PROCEDURE TRAY) ×2 IMPLANT
PAD ABD 7.5X8 STRL (GAUZE/BANDAGES/DRESSINGS) IMPLANT
SLEEVE SCD COMPRESS KNEE MED (MISCELLANEOUS) IMPLANT
SUT CHROMIC 0 CTX 36 (SUTURE) ×6 IMPLANT
SUT MON AB 4-0 PS1 27 (SUTURE) ×2 IMPLANT
SUT PDS AB 0 CT1 27 (SUTURE) ×4 IMPLANT
SUT VIC AB 3-0 CT1 27 (SUTURE) ×4
SUT VIC AB 3-0 CT1 TAPERPNT 27 (SUTURE) ×2 IMPLANT
TOWEL OR 17X24 6PK STRL BLUE (TOWEL DISPOSABLE) ×4 IMPLANT
TRAY FOLEY CATH 14FR (SET/KITS/TRAYS/PACK) ×2 IMPLANT
WATER STERILE IRR 1000ML POUR (IV SOLUTION) ×2 IMPLANT

## 2011-01-23 NOTE — Progress Notes (Signed)
The patient was re-examined with no change in status 

## 2011-01-23 NOTE — Anesthesia Postprocedure Evaluation (Signed)
Anesthesia Post Note  Patient: Robin Black  Procedure(s) Performed:  CESAREAN SECTION  Anesthesia type: Spinal  Patient location: PACU  Post pain: Pain level controlled  Post assessment: Post-op Vital signs reviewed  Last Vitals:  Filed Vitals:   01/23/11 1430  BP:   Pulse: 84  Temp:   Resp: 20    Post vital signs: Reviewed  Level of consciousness: awake  Complications: No apparent anesthesia complications

## 2011-01-23 NOTE — Transfer of Care (Signed)
Immediate Anesthesia Transfer of Care Note  Patient: Robin Black  Procedure(s) Performed:  CESAREAN SECTION  Patient Location: PACU  Anesthesia Type: Spinal  Level of Consciousness: awake, alert  and oriented  Airway & Oxygen Therapy: Patient Spontanous Breathing  Post-op Assessment: Report given to PACU RN and Post -op Vital signs reviewed and stable  Post vital signs: Reviewed and stable  Complications: No apparent anesthesia complications

## 2011-01-23 NOTE — Brief Op Note (Signed)
01/23/2011  1:52 PM  PATIENT:  Robin Black  35 y.o. female  PRE-OPERATIVE DIAGNOSIS:  previous cesarean section at term   POST-OPERATIVE DIAGNOSIS:  previous cesarean section at term  PROCEDURE:  Procedure(s): CESAREAN SECTION  SURGEON:  Surgeon(s): Meriel Pica, MD  PHYSICIAN ASSISTANT:   ASSISTANTS: none   ANESTHESIA:   spinal  EBL:  Total I/O In: 2200 [I.V.:2200] Out: 800 [Urine:200; Blood:600]  BLOOD ADMINISTERED:none  DRAINS: Urinary Catheter (Foley)   LOCAL MEDICATIONS USED:  NONE  SPECIMEN:  No Specimen  DISPOSITION OF SPECIMEN:  L+D  COUNTS:  YES  TOURNIQUET:  * No tourniquets in log *  DICTATION: .Dragon Dictation  PLAN OF CARE: Admit to inpatient   PATIENT DISPOSITION:  PACU - hemodynamically stable.   Delay start of Pharmacological VTE agent (>24hrs) due to surgical blood loss or risk of bleeding:  {YES/NO/NOT APPLICABLE:20182  Preoperative diagnosis: Term pregnancy, repeat cesarean section  Postoperative diagnosis: Same  Procedure: Repeat low transverse cesarean section  Surgeon: Marcelle Overlie  Anesthesia: Spinal  EBL: 700 cc  Specimens: None  Procedure and findings   Patient taken the operating room after an adequate level of spinal anesthetic was obtained with the patient in left tilt position the abdomen prepped and draped in the usual manner for sterile bowel procedures. Foley catheter positioned draining clear urine. Transverse incision was made excising the O. Scar this is carried down to the fascia which was incised and extended transversely. Rectus muscle divided in the midline peritoneum was entered superiorly without incident and extended in a vertical fashion. The vesicouterine serosa was incised and the bladder was bluntly and sharply dissected below. Bladder blade repositioned transverse incision made in the lower segment extended with bandage scissors clear fluid was noted. The patient delivered of a healthy female Apgars 8  and 8 the infant was suctioned cord clamped and passed the pediatric team for further care. The placenta was then delivered manually intact, uterus exteriorized cavity wiped clean with a laparotomy pack closure transversely of 0 chromic in a locked fashion followed by Dimetane o chromic. This was hemostatic the bladder flap area was intact and hemostatic bilateral tubes and ovaries are normal prior to closure sponge needle is McCash reported as correct x2. Enclose a running 2-0 Vicryl suture. Rectus muscles reapproximated midline with 2-0 Vicryl interrupted sutures. Fascia closure laterally to midline on either side with 0 PDS suture. Subcutaneous tissue was undermined to reduce tension this was made hemostatic with the Bovie a 4-0 Monocryl subcuticular suture with pressure dressing applied she did receive Ancef 1 g IV preop and Pitocin IV after the cord was clamped clear urine noted in the case mother and baby doing well at that point.  Dictated with dragon medical, Duke Salvia. Milana Obey.D.

## 2011-01-23 NOTE — Anesthesia Procedure Notes (Signed)
Spinal  Patient location during procedure: OR Start time: 01/23/2011 1:08 PM Staffing Anesthesiologist: Brayton Caves R Performed by: anesthesiologist  Preanesthetic Checklist Completed: patient identified, site marked, surgical consent, pre-op evaluation, timeout performed, IV checked, risks and benefits discussed and monitors and equipment checked Spinal Block Patient position: sitting Prep: DuraPrep Patient monitoring: heart rate, cardiac monitor, continuous pulse ox and blood pressure Approach: midline Location: L3-4 Injection technique: single-shot Needle Needle type: Sprotte  Needle gauge: 24 G Needle length: 9 cm Assessment Sensory level: T4 Additional Notes Patient identified.  Risk benefits discussed including failed block, incomplete pain control, headache, nerve damage, paralysis, blood pressure changes, nausea, vomiting, reactions to medication both toxic or allergic, and postpartum back pain.  Patient expressed understanding and wished to proceed.  All questions were answered.  Sterile technique used throughout procedure.  CSF was clear.  No parasthesia or other complications.  Please see nursing notes for vital signs.

## 2011-01-23 NOTE — Addendum Note (Signed)
Addendum  created 01/23/11 1618 by Cephus Shelling   Modules edited:Notes Section

## 2011-01-23 NOTE — Anesthesia Postprocedure Evaluation (Signed)
  Anesthesia Post-op Note  Patient: Robin Black  Procedure(s) Performed:  CESAREAN SECTION  Patient Location: Mother/Baby  Anesthesia Type: Spinal  Level of Consciousness: awake  Airway and Oxygen Therapy: Patient Spontanous Breathing  Post-op Pain: mild  Post-op Assessment: Post-op Vital signs reviewed  Post-op Vital Signs: Reviewed and stable  Complications: No apparent anesthesia complications

## 2011-01-23 NOTE — Anesthesia Preprocedure Evaluation (Signed)
Anesthesia Evaluation  Patient identified by MRN, date of birth, ID band Patient awake    Reviewed: Allergy & Precautions, H&P , NPO status , Patient's Chart, lab work & pertinent test results  Airway Mallampati: II      Dental No notable dental hx.    Pulmonary neg pulmonary ROS,  clear to auscultation  Pulmonary exam normal       Cardiovascular Exercise Tolerance: Good neg cardio ROS regular Normal    Neuro/Psych Negative Neurological ROS  Negative Psych ROS   GI/Hepatic negative GI ROS, Neg liver ROS,   Endo/Other  Negative Endocrine ROS  Renal/GU negative Renal ROS  Genitourinary negative   Musculoskeletal   Abdominal Normal abdominal exam  (+)   Peds  Hematology negative hematology ROS (+)   Anesthesia Other Findings   Reproductive/Obstetrics (+) Pregnancy                           Anesthesia Physical Anesthesia Plan  ASA: II  Anesthesia Plan: Spinal   Post-op Pain Management:    Induction:   Airway Management Planned:   Additional Equipment:   Intra-op Plan:   Post-operative Plan:   Informed Consent: I have reviewed the patients History and Physical, chart, labs and discussed the procedure including the risks, benefits and alternatives for the proposed anesthesia with the patient or authorized representative who has indicated his/her understanding and acceptance.     Plan Discussed with: Anesthesiologist, CRNA and Surgeon  Anesthesia Plan Comments:         Anesthesia Quick Evaluation

## 2011-01-23 NOTE — OR Nursing (Signed)
Fundus deviated to right. Lochia scant. Report  Given to PACU nurse

## 2011-01-24 LAB — CBC
Platelets: 169 10*3/uL (ref 150–400)
RBC: 3.27 MIL/uL — ABNORMAL LOW (ref 3.87–5.11)
WBC: 14.8 10*3/uL — ABNORMAL HIGH (ref 4.0–10.5)

## 2011-01-24 MED ORDER — HYDROCORTISONE 1 % EX CREA
TOPICAL_CREAM | Freq: Three times a day (TID) | CUTANEOUS | Status: DC | PRN
  Administered 2011-01-24: 09:00:00 via TOPICAL
  Filled 2011-01-24: qty 28

## 2011-01-24 NOTE — Progress Notes (Signed)
POD #1 Good pain relief Foley out no void yet, no flatus yet No RUQ pain C/O rash/itch on abdomen  Blood pressure 102/63, pulse 91, temperature 98.8 F (37.1 C), temperature source Oral, resp. rate 18, weight 82.555 kg (182 lb), SpO2 98.00%, unknown if currently breastfeeding.  Lungs CTA Abd soft, BS +, dressing C&D Fine reticular rash on abdomen outlining the edge of adhesive drape-rash is outside of the adhesive-probably from the prep solution Ext PAS on  Results for orders placed during the hospital encounter of 01/23/11 (from the past 24 hour(s))  CBC     Status: Abnormal   Collection Time   01/24/11  5:00 AM      Component Value Range   WBC 14.8 (*) 4.0 - 10.5 (K/uL)   RBC 3.27 (*) 3.87 - 5.11 (MIL/uL)   Hemoglobin 9.6 (*) 12.0 - 15.0 (g/dL)   HCT 16.1 (*) 09.6 - 46.0 (%)   MCV 89.9  78.0 - 100.0 (fL)   MCH 29.4  26.0 - 34.0 (pg)   MCHC 32.7  30.0 - 36.0 (g/dL)   RDW 04.5  40.9 - 81.1 (%)   Platelets 169  150 - 400 (K/uL)   A: Satisfactory progress post op     Depression on Zoloft 50mg , qd     Cholelitiasis  P: low fat diet      Zoloft      Hydrocortisone cream prn

## 2011-01-25 ENCOUNTER — Encounter (HOSPITAL_COMMUNITY): Payer: Self-pay | Admitting: *Deleted

## 2011-01-25 MED ORDER — PRENATAL PLUS 27-1 MG PO TABS: 1.0000 | ORAL_TABLET | Freq: Every day | ORAL | Status: DC

## 2011-01-25 MED ORDER — IBUPROFEN 800 MG PO TABS: 800.0000 mg | ORAL_TABLET | Freq: Three times a day (TID) | ORAL | Status: AC | PRN

## 2011-01-25 MED ORDER — BISACODYL 10 MG RE SUPP: 10.0000 mg | Freq: Every day | RECTAL | Status: AC | PRN

## 2011-01-25 MED ORDER — OXYCODONE-ACETAMINOPHEN 5-325 MG PO TABS: 1.0000 | ORAL_TABLET | Freq: Four times a day (QID) | ORAL | Status: AC | PRN

## 2011-01-25 NOTE — Discharge Summary (Signed)
Obstetric Discharge Summary Reason for Admission: cesarean section Prenatal Procedures: ultrasound Intrapartum Procedures: cesarean: low cervical, transverse Postpartum Procedures: none Complications-Operative and Postpartum: none Hemoglobin  Date Value Range Status  01/24/2011 9.6* 12.0-15.0 (g/dL) Final     HCT  Date Value Range Status  01/24/2011 29.4* 36.0-46.0 (%) Final    Discharge Diagnoses: Term Pregnancy-delivered  Discharge Information: Date: 01/25/2011 Activity: pelvic rest Diet: routine Medications: PNV, Ibuprofen and Percocet Condition: stable Instructions: refer to practice specific booklet Discharge to: home   Newborn Data: Live born female  Birth Weight: 7 lb 15.7 oz (3620 g) APGAR: 8, 8  Home with mother.  Janeya Deyo G 01/25/2011, 7:58 AM

## 2011-01-25 NOTE — Progress Notes (Signed)

## 2011-01-25 NOTE — Progress Notes (Signed)
Subjective: Postpartum Day 2: Cesarean Delivery Patient reports tolerating PO and no problems voiding.    Objective: Vital signs in last 24 hours: Temp:  [97.6 F (36.4 C)-98.6 F (37 C)] 98 F (36.7 C) (01/02 0620) Pulse Rate:  [90-111] 111  (01/02 0620) Resp:  [18-20] 20  (01/02 0620) BP: (102-115)/(70-80) 102/70 mmHg (01/02 0620) SpO2:  [99 %] 99 % (01/01 1301)  Physical Exam:  General: alert and cooperative Lochia: appropriate Uterine Fundus: firm Incision: healing well DVT Evaluation: No evidence of DVT seen on physical exam.   Basename 01/24/11 0500  HGB 9.6*  HCT 29.4*    Assessment/Plan: Status post Cesarean section. Doing well postoperatively.  Discharge home with standard precautions and return to clinic in1 weeks. Rectal supp prior to discharge  Robin Black G 01/25/2011, 7:50 AM

## 2011-01-25 NOTE — H&P (Signed)
NAME:  Robin Black, Robin Black            ACCOUNT NO.:  [id number]  MEDICAL RECORD NO.:  [id number]  LOCATION:                                 FACILITY:  PHYSICIAN:  Duke Salvia. Marcelle Overlie, M.D.DATE OF BIRTH:  [birth year]  DATE OF ADMISSION:  01/23/2011 DATE OF DISCHARGE:                             HISTORY & PHYSICAL   Date of scheduled surgery is January 23, 2011.  PREOP DIAGNOSIS:  Term pregnancy, previous cesarean section.  Declines vaginal birth after cesarean.  HISTORY OF PRESENT ILLNESS:  A [name], [name].  EDD January 29, 2011, presents for repeat cesarean section at term.  Her pregnancy course has been uncomplicated with GBS negative.  She has been on prophylactic Valtrex once daily since 36 weeks, has intermittently been on Cymbalta for anxiety during the pregnancy.  Blood type O positive.  PAST MEDICAL HISTORY:  Please see the [facility] form for details.  PHYSICAL EXAM:  VITAL SIGNS:  Temp 98.2, blood pressure 120/78. HEENT:  Unremarkable. NECK:  Supple without masses.  LUNGS:  Clear. CARDIOVASCULAR:  Regular rate and rhythm without murmurs, rubs, gallops noted. BREASTS:  Without masses, term fundal height.  Fetal heart rate 140, cervix is closed. EXTREMITIES/NEUROLOGICAL:  Unremarkable.  IMPRESSION:  Term pregnancy, previous cesarean section.  Declines vaginal birth after cesarean.  PLAN:  Repeat cesarean section.  This procedure including risks related to bleeding, infection, transfusion, adjacent organ injury, wound infection, phlebitis along with her expected recovery time, all reviewed with her which she understands and accepts.     Cariann Kinnamon M. Marcelle Overlie, M.D.     [facility]MED[facility]  D:  01/22/2011  T:  01/22/2011  Job:  [id number]
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
## 2013-11-24 ENCOUNTER — Encounter (HOSPITAL_COMMUNITY): Payer: Self-pay | Admitting: *Deleted

## 2015-09-07 ENCOUNTER — Other Ambulatory Visit: Payer: Self-pay | Admitting: Surgery

## 2015-09-07 DIAGNOSIS — K829 Disease of gallbladder, unspecified: Secondary | ICD-10-CM

## 2015-09-14 ENCOUNTER — Other Ambulatory Visit: Payer: BC Managed Care – PPO

## 2015-09-17 ENCOUNTER — Ambulatory Visit
Admission: RE | Admit: 2015-09-17 | Discharge: 2015-09-17 | Disposition: A | Discharge status: Home / Self Care | Payer: BC Managed Care – PPO | Source: Ambulatory Visit | Attending: Surgery | Admitting: Surgery

## 2015-09-17 DIAGNOSIS — K829 Disease of gallbladder, unspecified: Secondary | ICD-10-CM

## 2017-04-08 ENCOUNTER — Encounter (HOSPITAL_COMMUNITY): Payer: Self-pay

## 2017-04-08 ENCOUNTER — Observation Stay (HOSPITAL_COMMUNITY)
Admission: EM | Admit: 2017-04-08 | Discharge: 2017-04-10 | Disposition: A | Discharge status: Home / Self Care | Payer: BC Managed Care – PPO | Attending: General Surgery | Admitting: General Surgery

## 2017-04-08 ENCOUNTER — Emergency Department (HOSPITAL_COMMUNITY): Payer: BC Managed Care – PPO

## 2017-04-08 DIAGNOSIS — K8010 Calculus of gallbladder with chronic cholecystitis without obstruction: Secondary | ICD-10-CM | POA: Diagnosis not present

## 2017-04-08 DIAGNOSIS — R109 Unspecified abdominal pain: Secondary | ICD-10-CM | POA: Diagnosis present

## 2017-04-08 DIAGNOSIS — R1011 Right upper quadrant pain: Secondary | ICD-10-CM

## 2017-04-08 DIAGNOSIS — K810 Acute cholecystitis: Secondary | ICD-10-CM | POA: Diagnosis present

## 2017-04-08 DIAGNOSIS — K8020 Calculus of gallbladder without cholecystitis without obstruction: Secondary | ICD-10-CM

## 2017-04-08 LAB — CBC
HCT: 43.7 % (ref 36.0–46.0)
Hemoglobin: 14.5 g/dL (ref 12.0–15.0)
MCH: 31 pg (ref 26.0–34.0)
MCHC: 33.2 g/dL (ref 30.0–36.0)
MCV: 93.6 fL (ref 78.0–100.0)
Platelets: 273 10*3/uL (ref 150–400)
RBC: 4.67 MIL/uL (ref 3.87–5.11)
RDW: 13.1 % (ref 11.5–15.5)
WBC: 5.3 10*3/uL (ref 4.0–10.5)

## 2017-04-08 LAB — URINALYSIS, ROUTINE W REFLEX MICROSCOPIC
Bacteria, UA: NONE SEEN
Bilirubin Urine: NEGATIVE
Glucose, UA: NEGATIVE mg/dL
Ketones, ur: NEGATIVE mg/dL
Nitrite: NEGATIVE
Protein, ur: NEGATIVE mg/dL
Specific Gravity, Urine: 1.011 (ref 1.005–1.030)
pH: 6 (ref 5.0–8.0)

## 2017-04-08 LAB — LIPASE, BLOOD: Lipase: 38 U/L (ref 11–51)

## 2017-04-08 LAB — I-STAT BETA HCG BLOOD, ED (MC, WL, AP ONLY): I-stat hCG, quantitative: <5 m[IU]/mL (ref <5)

## 2017-04-08 LAB — COMPREHENSIVE METABOLIC PANEL
ALT: 18 U/L (ref 14–54)
AST: 22 U/L (ref 15–41)
Albumin: 3.9 g/dL (ref 3.5–5.0)
Alkaline Phosphatase: 76 U/L (ref 38–126)
Anion gap: 9 (ref 5–15)
BUN: 9 mg/dL (ref 6–20)
CO2: 23 mmol/L (ref 22–32)
Calcium: 8.9 mg/dL (ref 8.9–10.3)
Chloride: 106 mmol/L (ref 101–111)
Creatinine, Ser: 0.89 mg/dL (ref 0.44–1.00)
GFR calc Af Amer: >60 mL/min (ref >60)
GFR calc non Af Amer: >60 mL/min (ref >60)
Glucose, Bld: 111 mg/dL — ABNORMAL HIGH (ref 65–99)
Potassium: 3.9 mmol/L (ref 3.5–5.1)
Sodium: 138 mmol/L (ref 135–145)
Total Bilirubin: 0.5 mg/dL (ref 0.3–1.2)
Total Protein: 7 g/dL (ref 6.5–8.1)

## 2017-04-08 LAB — SURGICAL PCR SCREEN
MRSA, PCR: NEGATIVE
STAPHYLOCOCCUS AUREUS: NEGATIVE

## 2017-04-08 MED ORDER — DIPHENHYDRAMINE HCL 25 MG PO CAPS: 25.0000 mg | ORAL_CAPSULE | Freq: Four times a day (QID) | ORAL | Status: DC | PRN

## 2017-04-08 MED ORDER — KETOROLAC TROMETHAMINE 15 MG/ML IJ SOLN
15.0000 mg | Freq: Once | INTRAMUSCULAR | Status: AC
  Administered 2017-04-08: 15 mg via INTRAVENOUS
  Filled 2017-04-08: qty 1

## 2017-04-08 MED ORDER — ONDANSETRON 4 MG PO TBDP: 4.0000 mg | ORAL_TABLET | Freq: Four times a day (QID) | ORAL | Status: DC | PRN

## 2017-04-08 MED ORDER — SODIUM CHLORIDE 0.9 % IV SOLN
2.0000 g | INTRAVENOUS | Status: DC
  Administered 2017-04-09 (×2): 2 g via INTRAVENOUS
  Filled 2017-04-08 (×2): qty 20

## 2017-04-08 MED ORDER — CHLORHEXIDINE GLUCONATE CLOTH 2 % EX PADS
6.0000 | MEDICATED_PAD | Freq: Once | CUTANEOUS | Status: AC
  Administered 2017-04-08: 6 via TOPICAL

## 2017-04-08 MED ORDER — DIPHENHYDRAMINE HCL 50 MG/ML IJ SOLN: 25.0000 mg | Freq: Four times a day (QID) | INTRAMUSCULAR | Status: DC | PRN

## 2017-04-08 MED ORDER — MORPHINE SULFATE (PF) 4 MG/ML IV SOLN
2.0000 mg | INTRAVENOUS | Status: DC | PRN
  Filled 2017-04-08: qty 1

## 2017-04-08 MED ORDER — CHLORHEXIDINE GLUCONATE CLOTH 2 % EX PADS
6.0000 | MEDICATED_PAD | Freq: Once | CUTANEOUS | Status: AC
  Administered 2017-04-09: 6 via TOPICAL

## 2017-04-08 MED ORDER — SERTRALINE HCL 50 MG PO TABS
50.0000 mg | ORAL_TABLET | Freq: Every day | ORAL | Status: DC
  Administered 2017-04-09: 50 mg via ORAL
  Filled 2017-04-08: qty 1

## 2017-04-08 MED ORDER — SODIUM CHLORIDE 0.9 % IV BOLUS (SEPSIS)
1000.0000 mL | Freq: Once | INTRAVENOUS | Status: AC
  Administered 2017-04-08: 1000 mL via INTRAVENOUS

## 2017-04-08 MED ORDER — GABAPENTIN 300 MG PO CAPS
300.0000 mg | ORAL_CAPSULE | ORAL | Status: AC
  Administered 2017-04-09: 300 mg via ORAL
  Filled 2017-04-08: qty 1

## 2017-04-08 MED ORDER — ONDANSETRON HCL 4 MG/2ML IJ SOLN
4.0000 mg | Freq: Four times a day (QID) | INTRAMUSCULAR | Status: DC | PRN
  Administered 2017-04-09: 4 mg via INTRAVENOUS
  Filled 2017-04-08: qty 2

## 2017-04-08 MED ORDER — ONDANSETRON HCL 4 MG/2ML IJ SOLN
4.0000 mg | Freq: Once | INTRAMUSCULAR | Status: AC
  Administered 2017-04-08: 4 mg via INTRAVENOUS
  Filled 2017-04-08: qty 2

## 2017-04-08 MED ORDER — OXYCODONE HCL 5 MG PO TABS
5.0000 mg | ORAL_TABLET | ORAL | Status: DC | PRN
  Administered 2017-04-10: 5 mg via ORAL
  Filled 2017-04-08: qty 1

## 2017-04-08 MED ORDER — CELECOXIB 200 MG PO CAPS
200.0000 mg | ORAL_CAPSULE | ORAL | Status: AC
  Administered 2017-04-09: 200 mg via ORAL
  Filled 2017-04-08: qty 1

## 2017-04-08 MED ORDER — KCL IN DEXTROSE-NACL 20-5-0.45 MEQ/L-%-% IV SOLN
INTRAVENOUS | Status: DC
  Administered 2017-04-08 – 2017-04-10 (×3): via INTRAVENOUS
  Filled 2017-04-08 (×3): qty 1000

## 2017-04-08 MED ORDER — HYDROMORPHONE HCL 1 MG/ML IJ SOLN
1.0000 mg | Freq: Once | INTRAMUSCULAR | Status: AC
  Administered 2017-04-08: 0.25 mg via INTRAVENOUS
  Filled 2017-04-08: qty 1

## 2017-04-08 MED ORDER — ACETAMINOPHEN 500 MG PO TABS
1000.0000 mg | ORAL_TABLET | ORAL | Status: AC
  Administered 2017-04-09: 1000 mg via ORAL
  Filled 2017-04-08: qty 2

## 2017-04-08 NOTE — ED Notes (Signed)
Pt given saltine crackers per EDP

## 2017-04-08 NOTE — ED Triage Notes (Signed)
Pt reports RUQ pain with emesis since yesterday. PT reports she was told she needed her gallbladder a year ago.

## 2017-04-08 NOTE — ED Notes (Signed)
Patient transported to Ultrasound 

## 2017-04-08 NOTE — Progress Notes (Signed)
Robin CorwinKerrie-Jean Black is a 42 y.o. female patient admitted from ED awake, alert - oriented  X 4 - no acute distress noted.  VSS - Blood pressure 130/78, pulse (!) 52, temperature (!) 97.5 F (36.4 C), temperature source Oral, resp. rate 16, height 5\' 3"  (1.6 m), weight 72.6 kg (160 lb 0.9 oz), last menstrual period 04/06/2017, SpO2 100 %, unknown if currently breastfeeding.    IV in place, occlusive dsg intact without redness.       Will cont to eval and treat per MD orders.  Rolland PorterJosephine M Billie Intriago, RN 04/08/2017 8:31 PM

## 2017-04-08 NOTE — ED Notes (Signed)
ED Provider at bedside. 

## 2017-04-08 NOTE — ED Notes (Signed)
Pt ambulated to restroom located beside room, tolerated well. 

## 2017-04-08 NOTE — ED Notes (Signed)
Report attempted x 1

## 2017-04-08 NOTE — ED Notes (Signed)
Clear liquid diet tray ordered 

## 2017-04-08 NOTE — ED Notes (Signed)
Pt declined the remainder of her ordered dilaudid. After administering approx 0.25 mg, pt reported she felt incredibly "dizzy and weird" and that she would prefer to not have any more pain medication at this time. Will continue to monitor

## 2017-04-08 NOTE — ED Notes (Signed)
Pt reports she does not have to urinate at this time. Verbalized understanding for need for urine specimen. Will attempt to void after [place]

## 2017-04-08 NOTE — ED Notes (Signed)
Pt ambulatory to restroom with steady gait.

## 2017-04-08 NOTE — ED Provider Notes (Signed)
[facility] EMERGENCY DEPARTMENT Provider Note   CSN: [id number] Arrival date & time: 04/08/17  1251     History   Chief Complaint Chief Complaint  Patient presents with  . Abdominal Pain    HPI Robin Black is a 42 y.o. female.  HPI   42 year old female with abdominal pain.  Right upper quadrant with radiation into her back.  Symptom onset yesterday and persistent since then.  Associated with nausea and vomiting.  Anorexia.  She reports similar symptoms previously and was evaluated by general surgery.  She had an ultrasound which showed multiple gallstones.  Surgery was discussed at that time, but she deferred.   Past Medical History:  Diagnosis Date  . Abdominal pain   . Family history of breast cancer    grandmother - paternal side  . IBS (irritable bowel syndrome)   . Nausea & vomiting   . No pertinent past medical history   . STD (female)     Patient Active Problem List   Diagnosis Date Noted  . Viral gastroenteritis 08/26/2010  . Pregnancy, supervision of normal 08/26/2010  . Abdominal pain in pregnancy 08/26/2010    Past Surgical History:  Procedure Laterality Date  . CESAREAN SECTION    . CESAREAN SECTION  01/23/2011   Procedure: CESAREAN SECTION;  Surgeon: Meriel Pica, MD;  Location: [facility];  Service: Gynecology;  Laterality: N/A;  . left knee surgery      OB History    Gravida Para Term Preterm AB Living   2 2 2  0 0 2   SAB TAB Ectopic Multiple Live Births   0 0 0 0 2       Home Medications    Prior to Admission medications   Medication Sig Start Date End Date Taking? Authorizing Provider  prenatal vitamin w/FE, FA (PRENATAL 1 + 1) 27-1 MG TABS Take 1 tablet by mouth daily. 01/25/11   Julio Sicks, NP  sertraline (ZOLOFT) 50 MG tablet Take 50 mg by mouth daily.     [provider]    Family History No family history on file.  Social History Social History   Tobacco Use  . Smoking status: Never  Smoker  . Smokeless tobacco: Never Used  Substance Use Topics  . Alcohol use: No    Comment: socially  . Drug use: No     Allergies   Iodine and Zithromax [azithromycin dihydrate]   Review of Systems Review of Systems  All systems reviewed and negative, other than as noted in HPI.  Physical Exam Updated Vital Signs BP (!) 136/98 (BP Location: Right Arm)   Pulse 66   Temp 98.7 F (37.1 C) (Oral)   Resp 15   Ht 5\' 3"  (1.6 m)   Wt 72.6 kg (160 lb)   LMP 04/06/2017   SpO2 100%   BMI 28.34 kg/m   Physical Exam  Constitutional: She appears well-developed and well-nourished. No distress.  HENT:  Head: Normocephalic and atraumatic.  Eyes: Conjunctivae are normal. Right eye exhibits no discharge. Left eye exhibits no discharge.  Neck: Neck supple.  Cardiovascular: Normal rate, regular rhythm and normal heart sounds. Exam reveals no gallop and no friction rub.  No murmur heard. Pulmonary/Chest: Effort normal and breath sounds normal. No respiratory distress.  Abdominal: Soft. She exhibits no distension. There is tenderness.  Mild epigastric and right upper quadrant tenderness without rebound or guarding.  No distention.  Musculoskeletal: She exhibits no edema or tenderness.  Neurological: She  is alert.  Skin: Skin is warm and dry.  Psychiatric: She has a normal mood and affect. Her behavior is normal. Thought content normal.  Nursing note and vitals reviewed.    ED Treatments / Results  Labs (all labs ordered are listed, but only abnormal results are displayed) Labs Reviewed  COMPREHENSIVE METABOLIC PANEL - Abnormal; Notable for the following components:      Result Value   Glucose, Bld 111 (*)    All other components within normal limits  URINALYSIS, ROUTINE W REFLEX MICROSCOPIC - Abnormal; Notable for the following components:   Hgb urine dipstick MODERATE (*)    Leukocytes, UA LARGE (*)    Squamous Epithelial / LPF 0-5 (*)    All other components within normal  limits  LIPASE, BLOOD  CBC  I-STAT BETA HCG BLOOD, ED ([facility], [facility], [facility] ONLY)    EKG  EKG Interpretation None       Radiology [place] Abdomen Limited Ruq  Result Date: 04/08/2017 CLINICAL DATA:  Right upper quadrant pain EXAM: ULTRASOUND ABDOMEN LIMITED RIGHT UPPER QUADRANT COMPARISON:  Abdominal ultrasound September 17, 2015 FINDINGS: Gallbladder: There are multiple echogenic foci throughout the gallbladder which shadow, felt to represent gallstones. Gallbladder appears essentially filled with gallstones. Largest gallstone measures 1 cm in length. The gallbladder wall does not appear appreciably thickened, and there is no pericholecystic fluid. No sonographic Murphy sign noted by sonographer. Common bile duct: Diameter: 4 mm. No intrahepatic or extrahepatic biliary duct dilatation. Liver: No focal lesion identified. Within normal limits in parenchymal echogenicity. Portal vein is patent on color Doppler imaging with normal direction of blood flow towards the liver. IMPRESSION: Cholelithiasis without appreciable gallbladder wall thickening or pericholecystic fluid. Study otherwise unremarkable. Electronically Signed   By: Bretta BangWilliam  Woodruff III M.D.   On: 04/08/2017 16:26    Procedures Procedures (including critical care time)  Medications Ordered in ED Medications - No data to display   Initial Impression / Assessment and Plan / ED Course  I have reviewed the triage vital signs and the nursing notes.  Pertinent labs & imaging results that were available during my care of the patient were reviewed by me and considered in my medical decision making (see chart for details).    42 year old female with symptomatic cholelithiasis.  Ultrasound with multiple gallstones but no evidence of cholecystitis. Labs are reassuring.  She has previously seen Dr. Ezzard StandingNewman, general surgery, a little bit over a year ago for the same.    She was treated in the ED with pain and nausea medicine with some improvement.  Discussed discharge with continued symptomatic treatment and surgical follow-up again.  She is very hesitant to do this.  I discussed with Dr. Sheliah HatchKinsinger and appreciate his assistance.  Final Clinical Impressions(s) / ED Diagnoses   Final diagnoses:  Abdominal discomfort in right upper quadrant  Symptomatic cholelithiasis    ED Discharge Orders    None       Raeford RazorKohut, Arlethia Basso, MD 04/08/17 1714
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
## 2017-04-09 ENCOUNTER — Encounter (HOSPITAL_COMMUNITY): Payer: Self-pay

## 2017-04-09 ENCOUNTER — Observation Stay (HOSPITAL_COMMUNITY): Payer: BC Managed Care – PPO | Admitting: Certified Registered"

## 2017-04-09 ENCOUNTER — Other Ambulatory Visit: Payer: Self-pay

## 2017-04-09 ENCOUNTER — Ambulatory Visit (HOSPITAL_COMMUNITY)
Admission: EM | Disposition: A | Discharge status: Home / Self Care | Payer: Self-pay | Source: Home / Self Care | Attending: Emergency Medicine

## 2017-04-09 HISTORY — PX: CHOLECYSTECTOMY: SHX55

## 2017-04-09 LAB — COMPREHENSIVE METABOLIC PANEL
ALK PHOS: 78 U/L (ref 38–126)
ALT: 27 U/L (ref 14–54)
AST: 27 U/L (ref 15–41)
Albumin: 3.1 g/dL — ABNORMAL LOW (ref 3.5–5.0)
Anion gap: 9 (ref 5–15)
BILIRUBIN TOTAL: 0.6 mg/dL (ref 0.3–1.2)
BUN: 7 mg/dL (ref 6–20)
CALCIUM: 7.9 mg/dL — AB (ref 8.9–10.3)
CO2: 25 mmol/L (ref 22–32)
CREATININE: 0.88 mg/dL (ref 0.44–1.00)
Chloride: 105 mmol/L (ref 101–111)
GFR calc Af Amer: >60 mL/min (ref >60)
GFR calc non Af Amer: >60 mL/min (ref >60)
GLUCOSE: 107 mg/dL — AB (ref 65–99)
POTASSIUM: 3.6 mmol/L (ref 3.5–5.1)
Sodium: 139 mmol/L (ref 135–145)
Total Protein: 5.6 g/dL — ABNORMAL LOW (ref 6.5–8.1)

## 2017-04-09 LAB — CBC
HEMATOCRIT: 38 % (ref 36.0–46.0)
Hemoglobin: 12.1 g/dL (ref 12.0–15.0)
MCH: 30 pg (ref 26.0–34.0)
MCHC: 31.8 g/dL (ref 30.0–36.0)
MCV: 94.1 fL (ref 78.0–100.0)
PLATELETS: 253 10*3/uL (ref 150–400)
RBC: 4.04 MIL/uL (ref 3.87–5.11)
RDW: 12.9 % (ref 11.5–15.5)
WBC: 4.7 10*3/uL (ref 4.0–10.5)

## 2017-04-09 SURGERY — LAPAROSCOPIC CHOLECYSTECTOMY WITH INTRAOPERATIVE CHOLANGIOGRAM
Anesthesia: General | Site: Abdomen

## 2017-04-09 MED ORDER — MIDAZOLAM HCL 2 MG/2ML IJ SOLN
INTRAMUSCULAR | Status: AC
  Filled 2017-04-09: qty 2

## 2017-04-09 MED ORDER — PROMETHAZINE HCL 25 MG/ML IJ SOLN
6.2500 mg | INTRAMUSCULAR | Status: AC | PRN
  Administered 2017-04-09 (×2): 6.25 mg via INTRAVENOUS

## 2017-04-09 MED ORDER — BUPIVACAINE-EPINEPHRINE (PF) 0.25% -1:200000 IJ SOLN
INTRAMUSCULAR | Status: AC
  Filled 2017-04-09: qty 30

## 2017-04-09 MED ORDER — ACETAMINOPHEN 500 MG PO TABS
1000.0000 mg | ORAL_TABLET | Freq: Three times a day (TID) | ORAL | Status: DC
Start: 2017-04-09 — End: 2017-04-10
  Administered 2017-04-09 – 2017-04-10 (×3): 1000 mg via ORAL
  Filled 2017-04-09 (×3): qty 2

## 2017-04-09 MED ORDER — LACTATED RINGERS IV SOLN
INTRAVENOUS | Status: DC
  Administered 2017-04-09: 11:00:00 via INTRAVENOUS

## 2017-04-09 MED ORDER — ONDANSETRON HCL 4 MG/2ML IJ SOLN
INTRAMUSCULAR | Status: AC
  Filled 2017-04-09: qty 2

## 2017-04-09 MED ORDER — LACTATED RINGERS IV SOLN
INTRAVENOUS | Status: DC | PRN
  Administered 2017-04-09 (×2): via INTRAVENOUS

## 2017-04-09 MED ORDER — VALACYCLOVIR HCL 500 MG PO TABS
500.0000 mg | ORAL_TABLET | Freq: Every day | ORAL | Status: DC
  Filled 2017-04-09: qty 1

## 2017-04-09 MED ORDER — MEPERIDINE HCL 50 MG/ML IJ SOLN: 6.2500 mg | INTRAMUSCULAR | Status: DC | PRN

## 2017-04-09 MED ORDER — KETOROLAC TROMETHAMINE 30 MG/ML IJ SOLN
30.0000 mg | Freq: Once | INTRAMUSCULAR | Status: DC | PRN
  Administered 2017-04-09: 30 mg via INTRAVENOUS

## 2017-04-09 MED ORDER — ONDANSETRON HCL 4 MG/2ML IJ SOLN
INTRAMUSCULAR | Status: DC | PRN
  Administered 2017-04-09: 4 mg via INTRAVENOUS

## 2017-04-09 MED ORDER — BUPIVACAINE-EPINEPHRINE 0.25% -1:200000 IJ SOLN
INTRAMUSCULAR | Status: DC | PRN
  Administered 2017-04-09: 15 mL
  Administered 2017-04-09: 50 mL

## 2017-04-09 MED ORDER — LIDOCAINE HCL (CARDIAC) 20 MG/ML IV SOLN
INTRAVENOUS | Status: AC
  Filled 2017-04-09: qty 5

## 2017-04-09 MED ORDER — LIDOCAINE 2% (20 MG/ML) 5 ML SYRINGE
INTRAMUSCULAR | Status: DC | PRN
  Administered 2017-04-09: 100 mg via INTRAVENOUS

## 2017-04-09 MED ORDER — MIDAZOLAM HCL 5 MG/5ML IJ SOLN
INTRAMUSCULAR | Status: DC | PRN
  Administered 2017-04-09: 2 mg via INTRAVENOUS

## 2017-04-09 MED ORDER — PROPOFOL 10 MG/ML IV BOLUS
INTRAVENOUS | Status: DC | PRN
  Administered 2017-04-09: 150 mg via INTRAVENOUS

## 2017-04-09 MED ORDER — KETOROLAC TROMETHAMINE 30 MG/ML IJ SOLN
INTRAMUSCULAR | Status: AC
  Filled 2017-04-09: qty 1

## 2017-04-09 MED ORDER — 0.9 % SODIUM CHLORIDE (POUR BTL) OPTIME
TOPICAL | Status: DC | PRN
  Administered 2017-04-09: 1000 mL

## 2017-04-09 MED ORDER — FENTANYL CITRATE (PF) 250 MCG/5ML IJ SOLN
INTRAMUSCULAR | Status: AC
  Filled 2017-04-09: qty 5

## 2017-04-09 MED ORDER — SERTRALINE HCL 50 MG PO TABS: 75.0000 mg | ORAL_TABLET | Freq: Every day | ORAL | Status: DC

## 2017-04-09 MED ORDER — IOPAMIDOL (ISOVUE-300) INJECTION 61%
INTRAVENOUS | Status: AC
  Filled 2017-04-09: qty 50

## 2017-04-09 MED ORDER — FENTANYL CITRATE (PF) 100 MCG/2ML IJ SOLN
INTRAMUSCULAR | Status: DC | PRN
  Administered 2017-04-09: 50 ug via INTRAVENOUS
  Administered 2017-04-09: 100 ug via INTRAVENOUS

## 2017-04-09 MED ORDER — DEXAMETHASONE SODIUM PHOSPHATE 10 MG/ML IJ SOLN
INTRAMUSCULAR | Status: AC
  Filled 2017-04-09: qty 1

## 2017-04-09 MED ORDER — PROPOFOL 10 MG/ML IV BOLUS
INTRAVENOUS | Status: AC
  Filled 2017-04-09: qty 20

## 2017-04-09 MED ORDER — SUGAMMADEX SODIUM 200 MG/2ML IV SOLN
INTRAVENOUS | Status: AC
  Filled 2017-04-09: qty 2

## 2017-04-09 MED ORDER — SODIUM CHLORIDE 0.9 % IR SOLN
Status: DC | PRN
  Administered 2017-04-09: 1

## 2017-04-09 MED ORDER — PROMETHAZINE HCL 25 MG/ML IJ SOLN
INTRAMUSCULAR | Status: AC
  Administered 2017-04-09: 6.25 mg via INTRAVENOUS
  Filled 2017-04-09: qty 1

## 2017-04-09 MED ORDER — ROCURONIUM BROMIDE 10 MG/ML (PF) SYRINGE
PREFILLED_SYRINGE | INTRAVENOUS | Status: AC
  Filled 2017-04-09: qty 5

## 2017-04-09 MED ORDER — SUGAMMADEX SODIUM 200 MG/2ML IV SOLN
INTRAVENOUS | Status: DC | PRN
  Administered 2017-04-09: 150 mg via INTRAVENOUS

## 2017-04-09 MED ORDER — DEXAMETHASONE SODIUM PHOSPHATE 4 MG/ML IJ SOLN
INTRAMUSCULAR | Status: DC | PRN
  Administered 2017-04-09: 8 mg via INTRAVENOUS

## 2017-04-09 MED ORDER — KETOROLAC TROMETHAMINE 15 MG/ML IJ SOLN: 15.0000 mg | Freq: Four times a day (QID) | INTRAMUSCULAR | Status: DC | PRN

## 2017-04-09 MED ORDER — HYDROMORPHONE HCL 1 MG/ML IJ SOLN: 0.2500 mg | INTRAMUSCULAR | Status: DC | PRN

## 2017-04-09 MED ORDER — ROCURONIUM BROMIDE 100 MG/10ML IV SOLN
INTRAVENOUS | Status: DC | PRN
  Administered 2017-04-09: 40 mg via INTRAVENOUS

## 2017-04-09 SURGICAL SUPPLY — 49 items
ADH SKN CLS APL DERMABOND .7 (GAUZE/BANDAGES/DRESSINGS) ×1
APPLIER CLIP 5 13 M/L LIGAMAX5 (MISCELLANEOUS) ×3
APR CLP MED LRG 5 ANG JAW (MISCELLANEOUS) ×1
BAG SPEC RTRVL 10 TROC 200 (ENDOMECHANICALS) ×2
BLADE CLIPPER SURG (BLADE) IMPLANT
CANISTER SUCT 3000ML PPV (MISCELLANEOUS) ×3 IMPLANT
CHLORAPREP W/TINT 26ML (MISCELLANEOUS) ×3 IMPLANT
CLIP APPLIE 5 13 M/L LIGAMAX5 (MISCELLANEOUS) ×1 IMPLANT
COVER MAYO STAND STRL (DRAPES) ×3 IMPLANT
COVER SURGICAL LIGHT HANDLE (MISCELLANEOUS) ×3 IMPLANT
DERMABOND ADVANCED (GAUZE/BANDAGES/DRESSINGS) ×2
DERMABOND ADVANCED .7 DNX12 (GAUZE/BANDAGES/DRESSINGS) ×1 IMPLANT
DRAPE C-ARM 42X72 X-RAY (DRAPES) ×3 IMPLANT
ELECT REM PT RETURN 9FT ADLT (ELECTROSURGICAL) ×3
ELECTRODE REM PT RTRN 9FT ADLT (ELECTROSURGICAL) ×1 IMPLANT
FILTER SMOKE EVAC LAPAROSHD (FILTER) ×2 IMPLANT
GLOVE BIO SURGEON STRL SZ8 (GLOVE) ×3 IMPLANT
GLOVE BIOGEL PI IND STRL 6.5 (GLOVE) IMPLANT
GLOVE BIOGEL PI IND STRL 8 (GLOVE) ×1 IMPLANT
GLOVE BIOGEL PI INDICATOR 6.5 (GLOVE) ×4
GLOVE BIOGEL PI INDICATOR 8 (GLOVE) ×2
GLOVE SURG SS PI 6.0 STRL IVOR (GLOVE) ×2 IMPLANT
GOWN STRL REUS W/ TWL LRG LVL3 (GOWN DISPOSABLE) ×2 IMPLANT
GOWN STRL REUS W/ TWL XL LVL3 (GOWN DISPOSABLE) ×1 IMPLANT
GOWN STRL REUS W/TWL LRG LVL3 (GOWN DISPOSABLE) ×6
GOWN STRL REUS W/TWL XL LVL3 (GOWN DISPOSABLE) ×3
KIT BASIN OR (CUSTOM PROCEDURE TRAY) ×3 IMPLANT
KIT ROOM TURNOVER OR (KITS) ×3 IMPLANT
L-HOOK LAP DISP 36CM (ELECTROSURGICAL) ×3
LHOOK LAP DISP 36CM (ELECTROSURGICAL) ×1 IMPLANT
NEEDLE 22X1 1/2 (OR ONLY) (NEEDLE) ×3 IMPLANT
NS IRRIG 1000ML POUR BTL (IV SOLUTION) ×3 IMPLANT
PAD ARMBOARD 7.5X6 YLW CONV (MISCELLANEOUS) ×3 IMPLANT
PENCIL BUTTON HOLSTER BLD 10FT (ELECTRODE) ×3 IMPLANT
POUCH RETRIEVAL ECOSAC 10 (ENDOMECHANICALS) ×1 IMPLANT
POUCH RETRIEVAL ECOSAC 10MM (ENDOMECHANICALS) ×4
SCISSORS LAP 5X35 DISP (ENDOMECHANICALS) ×3 IMPLANT
SET CHOLANGIOGRAPH 5 50 .035 (SET/KITS/TRAYS/PACK) ×3 IMPLANT
SET IRRIG TUBING LAPAROSCOPIC (IRRIGATION / IRRIGATOR) ×3 IMPLANT
SLEEVE ENDOPATH XCEL 5M (ENDOMECHANICALS) ×6 IMPLANT
SPECIMEN JAR SMALL (MISCELLANEOUS) ×3 IMPLANT
SUT VIC AB 4-0 PS2 27 (SUTURE) ×3 IMPLANT
TOWEL OR 17X24 6PK STRL BLUE (TOWEL DISPOSABLE) ×3 IMPLANT
TOWEL OR 17X26 10 PK STRL BLUE (TOWEL DISPOSABLE) ×3 IMPLANT
TRAY LAPAROSCOPIC MC (CUSTOM PROCEDURE TRAY) ×3 IMPLANT
TROCAR XCEL BLUNT TIP 100MML (ENDOMECHANICALS) ×3 IMPLANT
TROCAR XCEL NON-BLD 5MMX100MML (ENDOMECHANICALS) ×3 IMPLANT
TUBING INSUFFLATION (TUBING) ×3 IMPLANT
WATER STERILE IRR 1000ML POUR (IV SOLUTION) ×3 IMPLANT

## 2017-04-09 NOTE — Transfer of Care (Signed)
Immediate Anesthesia Transfer of Care Note  Patient: Robin CorwinKerrie-Jean Sereno  Procedure(s) Performed: LAPAROSCOPIC CHOLECYSTECTOMY WITH INTRAOPERATIVE CHOLANGIOGRAM (N/A Abdomen)  Patient Location: PACU  Anesthesia Type:General  Level of Consciousness: awake, alert , oriented and patient cooperative  Airway & Oxygen Therapy: Patient Spontanous Breathing  Post-op Assessment: Report to RN at bedside.  VSS  Post vital signs: Reviewed and stable  Last Vitals:  Vitals:   04/09/17 0551 04/09/17 1028  BP: 114/75 114/72  Pulse: 61 61  Resp: 20 18  Temp: 36.7 C 36.6 C  SpO2: 100% 99%    Last Pain:  Vitals:   04/09/17 1028  TempSrc: Oral  PainSc:       Patients Stated Pain Goal: 3 (04/09/17 16100812)  Complications: No apparent anesthesia complications

## 2017-04-09 NOTE — Discharge Instructions (Signed)

## 2017-04-09 NOTE — Anesthesia Preprocedure Evaluation (Signed)
Anesthesia Evaluation  Patient identified by MRN, date of birth, ID band Patient awake    Reviewed: Allergy & Precautions, NPO status , Patient's Chart, lab work & pertinent test results  Airway Mallampati: I       Dental no notable dental hx. (+) Teeth Intact   Pulmonary neg pulmonary ROS,    Pulmonary exam normal breath sounds clear to auscultation       Cardiovascular negative cardio ROS Normal cardiovascular exam Rhythm:Regular Rate:Normal     Neuro/Psych PSYCHIATRIC DISORDERS Anxiety negative neurological ROS     GI/Hepatic negative GI ROS, Neg liver ROS,   Endo/Other  negative endocrine ROS  Renal/GU negative Renal ROS  negative genitourinary   Musculoskeletal negative musculoskeletal ROS (+)   Abdominal Normal abdominal exam  (+)   Peds  Hematology negative hematology ROS (+)   Anesthesia Other Findings   Reproductive/Obstetrics negative OB ROS                             Anesthesia Physical Anesthesia Plan  ASA: II  Anesthesia Plan: General   Post-op Pain Management:    Induction: Intravenous  PONV Risk Score and Plan: 4 or greater and Ondansetron, Dexamethasone, Scopolamine patch - Pre-op and Midazolam  Airway Management Planned: Oral ETT  Additional Equipment:   Intra-op Plan:   Post-operative Plan: Extubation in OR  Informed Consent: I have reviewed the patients History and Physical, chart, labs and discussed the procedure including the risks, benefits and alternatives for the proposed anesthesia with the patient or authorized representative who has indicated his/her understanding and acceptance.   Dental advisory given  Plan Discussed with: CRNA and Surgeon  Anesthesia Plan Comments:         Anesthesia Quick Evaluation

## 2017-04-09 NOTE — Op Note (Signed)
04/08/2017 - 04/09/2017  12:07 PM  PATIENT:  Robin Black  42 y.o. female  PRE-OPERATIVE DIAGNOSIS:  Cholecystitis with Cholelithiasis  POST-OPERATIVE DIAGNOSIS:  Cholecystitis with Cholelithiasis  PROCEDURE:  Procedure(s): LAPAROSCOPIC CHOLECYSTECTOMY  SURGEON:  Surgeon(s): Violeta Gelinashompson, Myya Meenach, MD  ASSISTANTS: Wells GuilesKelly Rayburn, PAC   ANESTHESIA:   local and general  EBL:  No intake/output data recorded.  BLOOD ADMINISTERED:none  DRAINS: none   SPECIMEN:  Excision  DISPOSITION OF SPECIMEN:  PATHOLOGY Findings: Acute cholecystitis, small cystic duct  Procedure in detail: [name] presents for cholecystectomy.  She was identified in the preop holding area.  She received intravenous antibiotics.  She was brought to the operating room and general endotracheal anesthesia was administered by the anesthesia staff.  Her abdomen was prepped and draped in sterile fashion.  Timeout procedure was performed.The infraumbilical region was infiltrated with local. Infraumbilical incision was made. Subcutaneous tissues were dissected down revealing the anterior fascia. This was divided sharply along the midline. Peritoneal cavity was entered under direct vision without complication. A 0 Vicryl pursestring was placed around the fascial opening. Hassan trocar was inserted into the abdomen. The abdomen was insufflated with carbon dioxide in standard fashion. Under direct vision a 5 mm epigastric port and 5 mm right-sided port x2 were placed.  Local was used at each port site.  Laparoscopic expiration revealed an inflamed gallbladder.  The dome was retracted superior medially.  The infundibulum was retracted inferolaterally.  Dissection began laterally and progressed medially identifying the cystic artery and cystic duct.  Cystic artery was clipped twice proximally and divided.  The cystic duct was dissected further until we had a critical view of safety.  Once we had excellent visualization, the cystic duct was  noted to be quite short and small so we did not do a cleaned Cheree DittoGraham.  3 clips were placed proximally on it and one was placed distally and it was divided.  The gallbladder was taken off the liver bed using cautery and achieving excellent hemostasis along the way.  The gallbladder was placed in a bag and removed from the abdomen.  It was sent to pathology.  The liver bed was irrigated and hemostasis was ensured.  Clips are in good position.  Irrigation fluid was evacuated.  Ports were removed under direct vision.  Pneumoperitoneum was released.  Infraumbilical fascia was closed by tying the pursestring.  All 4 wounds were irrigated and closed with running 4-0 Vicryl followed by Dermabond.  All counts were correct.  She tolerated the procedure well without apparent complication was taken recovery in stable condition.  COUNTS:  YES  DICTATION: .Dragon Dictation  PATIENT DISPOSITION:  PACU - hemodynamically stable.   Delay start of Pharmacological VTE agent (>24hrs) due to surgical blood loss or risk of bleeding:  no  Violeta GelinasBurke Talma Aguillard, MD, MPH, FACS Pager: [phone]  3/18/201912:07 PM

## 2017-04-09 NOTE — Anesthesia Procedure Notes (Signed)
Procedure Name: Intubation Date/Time: 04/09/2017 11:29 AM Performed by: Julian ReilWelty, Everard Interrante F, CRNA Pre-anesthesia Checklist: Emergency Drugs available, Suction available, Patient being monitored, Timeout performed and Patient identified Patient Re-evaluated:Patient Re-evaluated prior to induction Oxygen Delivery Method: Circle system utilized Preoxygenation: Pre-oxygenation with 100% oxygen Induction Type: IV induction Ventilation: Mask ventilation without difficulty Laryngoscope Size: Miller and 3 Grade View: Grade I Tube type: Oral Tube size: 7.5 mm Number of attempts: 1 Airway Equipment and Method: Stylet Placement Confirmation: ETT inserted through vocal cords under direct vision,  positive ETCO2 and breath sounds checked- equal and bilateral Secured at: 23 cm Tube secured with: Tape Dental Injury: Teeth and Oropharynx as per pre-operative assessment  Comments: 4x4s bite block used.

## 2017-04-09 NOTE — Progress Notes (Signed)
   Subjective/Chief Complaint: RUQ pain   Objective: Vital signs in last 24 hours: Temp:  [97.5 F (36.4 C)-98.7 F (37.1 C)] 98 F (36.7 C) (03/18 0551) Pulse Rate:  [51-71] 61 (03/18 0551) Resp:  [14-20] 20 (03/18 0551) BP: (96-139)/(66-98) 114/75 (03/18 0551) SpO2:  [97 %-100 %] 100 % (03/18 0551) Weight:  [72.6 kg (160 lb)-72.6 kg (160 lb 0.9 oz)] 72.6 kg (160 lb 0.9 oz) (03/17 2001) Last BM Date: 04/08/17  Intake/Output from previous day: 03/17 0701 - 03/18 0700 In: 2024 [P.O.:240; I.V.:685; IV Piggyback:1099] Out: 200 [Urine:200] Intake/Output this shift: No intake/output data recorded.  General appearance: alert and cooperative Resp: clear to auscultation bilaterally Cardio: regular rate and rhythm GI: soft, tender RUQ without guarding  Lab Results:  Recent Labs    04/08/17 1332 04/09/17 0401  WBC 5.3 4.7  HGB 14.5 12.1  HCT 43.7 38.0  PLT 273 253   BMET Recent Labs    04/08/17 1332 04/09/17 0401  NA 138 139  K 3.9 3.6  CL 106 105  CO2 23 25  GLUCOSE 111* 107*  BUN 9 7  CREATININE 0.89 0.88  CALCIUM 8.9 7.9*   PT/INR No results for input(s): LABPROT, INR in the last 72 hours. ABG No results for input(s): PHART, HCO3 in the last 72 hours.  Invalid input(s): PCO2, PO2  Studies/Results: Koreas Abdomen Limited Ruq  Result Date: 04/08/2017 CLINICAL DATA:  Right upper quadrant pain EXAM: ULTRASOUND ABDOMEN LIMITED RIGHT UPPER QUADRANT COMPARISON:  Abdominal ultrasound September 17, 2015 FINDINGS: Gallbladder: There are multiple echogenic foci throughout the gallbladder which shadow, felt to represent gallstones. Gallbladder appears essentially filled with gallstones. Largest gallstone measures 1 cm in length. The gallbladder wall does not appear appreciably thickened, and there is no pericholecystic fluid. No sonographic Murphy sign noted by sonographer. Common bile duct: Diameter: 4 mm. No intrahepatic or extrahepatic biliary duct dilatation. Liver: No  focal lesion identified. Within normal limits in parenchymal echogenicity. Portal vein is patent on color Doppler imaging with normal direction of blood flow towards the liver. IMPRESSION: Cholelithiasis without appreciable gallbladder wall thickening or pericholecystic fluid. Study otherwise unremarkable. Electronically Signed   By: Bretta BangWilliam  Woodruff III M.D.   On: 04/08/2017 16:26    Anti-infectives: Anti-infectives (From admission, onward)   Start     Dose/Rate Route Frequency Ordered Stop   04/08/17 1900  cefTRIAXone (ROCEPHIN) 2 g in sodium chloride 0.9 % 100 mL IVPB     2 g 200 mL/hr over 30 Minutes Intravenous Every 24 hours 04/08/17 1854        Assessment/Plan: Cholecystitis with cholelithiasis - for laparoscopic cholecystectomy today. Procedure, risks, and benefits again reviewed and she agrees.  Robin GelinasBurke Mohamadou Maciver, MD, MPH, FACS Trauma: [phone] General Surgery: [phone]   LOS: 0 days    Robin MaladyBurke E Prescilla Black 04/09/2017

## 2017-04-10 ENCOUNTER — Encounter (HOSPITAL_COMMUNITY): Payer: Self-pay | Admitting: General Surgery

## 2017-04-10 LAB — HIV ANTIBODY (ROUTINE TESTING W REFLEX): HIV Screen 4th Generation wRfx: NONREACTIVE

## 2017-04-10 MED ORDER — OXYCODONE HCL 5 MG PO TABS: 5.0000 mg | ORAL_TABLET | ORAL | 0 refills | Status: AC | PRN

## 2017-04-10 MED ORDER — ACETAMINOPHEN 500 MG PO TABS: 500.0000 mg | ORAL_TABLET | Freq: Four times a day (QID) | ORAL | 0 refills | Status: AC | PRN

## 2017-04-10 NOTE — Anesthesia Postprocedure Evaluation (Signed)
Anesthesia Post Note  Patient: Robin CorwinKerrie-Jean Black  Procedure(s) Performed: LAPAROSCOPIC CHOLECYSTECTOMY WITH INTRAOPERATIVE CHOLANGIOGRAM (N/A Abdomen)     Patient location during evaluation: PACU Anesthesia Type: General Level of consciousness: awake Pain management: pain level controlled Vital Signs Assessment: post-procedure vital signs reviewed and stable Respiratory status: spontaneous breathing Cardiovascular status: stable Postop Assessment: no apparent nausea or vomiting Anesthetic complications: no    Last Vitals:  Vitals:   04/10/17 0054 04/10/17 0405  BP: 110/73 105/77  Pulse: 61 64  Resp: 18 17  Temp: 36.7 C 36.8 C  SpO2: 98% 100%    Last Pain:  Vitals:   04/10/17 0905  TempSrc:   PainSc: 6    Pain Goal: Patients Stated Pain Goal: 2 (04/10/17 0905)               Khrystal Jeanmarie JR,JOHN Susann GivensFRANKLIN

## 2017-04-10 NOTE — Progress Notes (Signed)
Pt is discharged to go home. Discharge instructions and prescriptions given. 

## 2017-04-10 NOTE — Discharge Summary (Signed)
[facility] Discharge Summary   Patient ID: Robin Black MRN: [id number] DOB/AGE: 40/09/1975 40 y.o.  Admit date: 04/08/2017 Discharge date: 04/10/2017  Admitting Diagnosis: Acute cholecystitis  Discharge Diagnosis Patient Active Problem List   Diagnosis Date Noted  . Acute cholecystitis 04/08/2017  . Viral gastroenteritis 08/26/2010  . Pregnancy, supervision of normal 08/26/2010  . Abdominal pain in pregnancy 08/26/2010    Consultants None  Imaging: Koreas Abdomen Limited Ruq  Result Date: 04/08/2017 CLINICAL DATA:  Right upper quadrant pain EXAM: ULTRASOUND ABDOMEN LIMITED RIGHT UPPER QUADRANT COMPARISON:  Abdominal ultrasound September 17, 2015 FINDINGS: Gallbladder: There are multiple echogenic foci throughout the gallbladder which shadow, felt to represent gallstones. Gallbladder appears essentially filled with gallstones. Largest gallstone measures 1 cm in length. The gallbladder wall does not appear appreciably thickened, and there is no pericholecystic fluid. No sonographic Murphy sign noted by sonographer. Common bile duct: Diameter: 4 mm. No intrahepatic or extrahepatic biliary duct dilatation. Liver: No focal lesion identified. Within normal limits in parenchymal echogenicity. Portal vein is patent on color Doppler imaging with normal direction of blood flow towards the liver. IMPRESSION: Cholelithiasis without appreciable gallbladder wall thickening or pericholecystic fluid. Study otherwise unremarkable. Electronically Signed   By: Bretta BangWilliam  Woodruff III M.D.   On: 04/08/2017 16:26    Procedures Dr. Janee Mornhompson (04/09/17) - Laparoscopic Cholecystectomy  Hospital Course:  Patient is a 42 year old female [facility].  Workup showed acute cholecystitis.  Patient was admitted and underwent procedure listed above.  Tolerated procedure well and was transferred to the floor.  Diet was advanced as tolerated.  On POD#1, the patient was voiding  well, tolerating diet, ambulating well, pain well controlled, vital signs stable, incisions c/d/i and felt stable for discharge home.  Patient will follow up in our office in 2 weeks and knows to call with questions or concerns. She will call to confirm appointment date/time.    Physical Exam: General:  Alert, NAD, pleasant, comfortable Abd:  Soft, ND, mild tenderness, incisions C/D/I  Allergies as of 04/10/2017      Reactions   Iodine Rash   Zithromax [azithromycin Dihydrate] Rash      Medication List    TAKE these medications   acetaminophen 500 MG tablet Commonly known as:  TYLENOL Take 1 tablet (500 mg total) by mouth every 6 (six) hours as needed for mild pain.   ibuprofen 200 MG tablet Commonly known as:  ADVIL,MOTRIN Take 400 mg by mouth every 6 (six) hours as needed (for pain).   oxyCODONE 5 MG immediate release tablet Commonly known as:  Oxy IR/ROXICODONE Take 1 tablet (5 mg total) by mouth every 4 (four) hours as needed for moderate pain.   sertraline 50 MG tablet Commonly known as:  ZOLOFT Take 75 mg by mouth every morning.   valACYclovir 500 MG tablet Commonly known as:  VALTREX Take 500 mg by mouth daily.        Follow-up Information    [facility] Go on 04/24/2017.   Specialty:  General Surgery Why:  Your appointment is at 11:15 AM. Please arrive 30 min prior to appointment time. Bring photo ID and insurance information.  Contact information: [address] [place] [address] [phone]           Signed: Wells GuilesKelly Rayburn, [facility] 04/10/2017, 9:18 AM Pager: [phone] Consults: [phone] Mon-Fri 7:00 am-4:30 pm Sat-Sun 7:00 am-11:30 am

## 2019-07-01 ENCOUNTER — Other Ambulatory Visit: Payer: Self-pay | Admitting: Radiology

## 2019-07-01 DIAGNOSIS — N611 Abscess of the breast and nipple: Secondary | ICD-10-CM

## 2019-07-01 DIAGNOSIS — N63 Unspecified lump in unspecified breast: Secondary | ICD-10-CM

## 2019-07-11 ENCOUNTER — Ambulatory Visit
Admission: RE | Admit: 2019-07-11 | Discharge: 2019-07-11 | Disposition: A | Discharge status: Home / Self Care | Payer: BC Managed Care – PPO | Source: Ambulatory Visit | Attending: Radiology | Admitting: Radiology

## 2019-07-11 ENCOUNTER — Other Ambulatory Visit: Payer: Self-pay

## 2019-07-11 DIAGNOSIS — N63 Unspecified lump in unspecified breast: Secondary | ICD-10-CM

## 2019-07-11 DIAGNOSIS — N611 Abscess of the breast and nipple: Secondary | ICD-10-CM

## 2019-07-16 ENCOUNTER — Other Ambulatory Visit: Payer: BC Managed Care – PPO

## 2020-02-18 ENCOUNTER — Other Ambulatory Visit: Payer: Self-pay | Admitting: Obstetrics and Gynecology

## 2020-02-18 DIAGNOSIS — N6002 Solitary cyst of left breast: Secondary | ICD-10-CM

## 2020-02-25 ENCOUNTER — Other Ambulatory Visit: Payer: Self-pay | Admitting: Obstetrics and Gynecology

## 2020-02-25 DIAGNOSIS — N63 Unspecified lump in unspecified breast: Secondary | ICD-10-CM

## 2020-04-02 ENCOUNTER — Ambulatory Visit
Admission: RE | Admit: 2020-04-02 | Discharge: 2020-04-02 | Disposition: A | Discharge status: Home / Self Care | Payer: PRIVATE HEALTH INSURANCE | Source: Ambulatory Visit | Attending: Obstetrics and Gynecology | Admitting: Obstetrics and Gynecology

## 2020-04-02 ENCOUNTER — Ambulatory Visit
Admission: RE | Admit: 2020-04-02 | Discharge: 2020-04-02 | Disposition: A | Discharge status: Home / Self Care | Payer: BC Managed Care – PPO | Source: Ambulatory Visit | Attending: Obstetrics and Gynecology | Admitting: Obstetrics and Gynecology

## 2020-04-02 ENCOUNTER — Other Ambulatory Visit: Payer: Self-pay

## 2020-04-02 DIAGNOSIS — N63 Unspecified lump in unspecified breast: Secondary | ICD-10-CM

## 2021-11-24 ENCOUNTER — Other Ambulatory Visit: Payer: Self-pay | Admitting: Obstetrics and Gynecology

## 2021-11-24 DIAGNOSIS — Z1231 Encounter for screening mammogram for malignant neoplasm of breast: Secondary | ICD-10-CM

## 2021-12-07 ENCOUNTER — Other Ambulatory Visit: Payer: Self-pay | Admitting: Obstetrics and Gynecology

## 2021-12-07 DIAGNOSIS — N63 Unspecified lump in unspecified breast: Secondary | ICD-10-CM

## 2021-12-23 ENCOUNTER — Ambulatory Visit
Admission: RE | Admit: 2021-12-23 | Discharge: 2021-12-23 | Disposition: A | Discharge status: Home / Self Care | Payer: Commercial Managed Care - PPO | Source: Ambulatory Visit | Attending: Obstetrics and Gynecology | Admitting: Obstetrics and Gynecology

## 2021-12-23 ENCOUNTER — Ambulatory Visit
Admission: RE | Admit: 2021-12-23 | Discharge: 2021-12-23 | Disposition: A | Discharge status: Home / Self Care | Payer: PRIVATE HEALTH INSURANCE | Source: Ambulatory Visit | Attending: Obstetrics and Gynecology | Admitting: Obstetrics and Gynecology

## 2021-12-23 ENCOUNTER — Other Ambulatory Visit: Payer: Self-pay | Admitting: Obstetrics and Gynecology

## 2021-12-23 DIAGNOSIS — N63 Unspecified lump in unspecified breast: Secondary | ICD-10-CM

## 2021-12-26 ENCOUNTER — Ambulatory Visit
Admission: RE | Admit: 2021-12-26 | Discharge: 2021-12-26 | Disposition: A | Discharge status: Home / Self Care | Payer: Commercial Managed Care - PPO | Source: Ambulatory Visit | Attending: Obstetrics and Gynecology | Admitting: Obstetrics and Gynecology

## 2021-12-26 DIAGNOSIS — N63 Unspecified lump in unspecified breast: Secondary | ICD-10-CM

## 2022-01-25 ENCOUNTER — Ambulatory Visit: Payer: PRIVATE HEALTH INSURANCE

## 2022-06-02 LAB — EXTERNAL GENERIC LAB PROCEDURE: COLOGUARD: NEGATIVE

## 2023-05-16 ENCOUNTER — Other Ambulatory Visit: Payer: Self-pay | Admitting: Obstetrics and Gynecology

## 2023-05-16 DIAGNOSIS — R928 Other abnormal and inconclusive findings on diagnostic imaging of breast: Secondary | ICD-10-CM

## 2023-06-04 ENCOUNTER — Ambulatory Visit
Admission: RE | Admit: 2023-06-04 | Discharge: 2023-06-04 | Disposition: A | Discharge status: Home / Self Care | Source: Ambulatory Visit | Attending: Obstetrics and Gynecology | Admitting: Obstetrics and Gynecology

## 2023-06-04 ENCOUNTER — Ambulatory Visit

## 2023-06-04 DIAGNOSIS — R928 Other abnormal and inconclusive findings on diagnostic imaging of breast: Secondary | ICD-10-CM
